# Patient Record
Sex: Female | Born: 1980 | Race: Black or African American | Hispanic: No | Marital: Single | Smoking: Never smoker
Health system: Southern US, Community
[De-identification: ages and names within clinical notes are randomized; demographics above are authoritative.]

## PROBLEM LIST (undated history)

## (undated) DIAGNOSIS — O24419 Gestational diabetes mellitus in pregnancy, unspecified control: Secondary | ICD-10-CM

## (undated) DIAGNOSIS — Z789 Other specified health status: Secondary | ICD-10-CM

## (undated) HISTORY — DX: Other specified health status: Z78.9

## (undated) HISTORY — PX: TUBAL LIGATION: SHX77

---
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
## 2002-11-25 ENCOUNTER — Emergency Department (HOSPITAL_COMMUNITY): Admission: EM | Admit: 2002-11-25 | Discharge: 2002-11-25 | Payer: Self-pay | Admitting: Emergency Medicine

## 2006-03-19 ENCOUNTER — Emergency Department (HOSPITAL_COMMUNITY): Admission: EM | Admit: 2006-03-19 | Discharge: 2006-03-19 | Payer: Self-pay | Admitting: Emergency Medicine

## 2007-05-09 ENCOUNTER — Inpatient Hospital Stay (HOSPITAL_COMMUNITY): Admission: AD | Admit: 2007-05-09 | Discharge: 2007-05-09 | Payer: Self-pay | Admitting: Obstetrics and Gynecology

## 2007-11-04 ENCOUNTER — Emergency Department (HOSPITAL_COMMUNITY): Admission: EM | Admit: 2007-11-04 | Discharge: 2007-11-04 | Payer: Self-pay | Admitting: Emergency Medicine

## 2008-01-24 ENCOUNTER — Emergency Department (HOSPITAL_COMMUNITY): Admission: EM | Admit: 2008-01-24 | Discharge: 2008-01-24 | Payer: Self-pay | Admitting: Emergency Medicine

## 2009-02-15 ENCOUNTER — Emergency Department (HOSPITAL_COMMUNITY): Admission: EM | Admit: 2009-02-15 | Discharge: 2009-02-15 | Payer: Self-pay | Admitting: Emergency Medicine

## 2009-11-11 ENCOUNTER — Emergency Department (HOSPITAL_COMMUNITY): Admission: EM | Admit: 2009-11-11 | Discharge: 2009-11-11 | Payer: Self-pay | Admitting: Emergency Medicine

## 2010-01-02 ENCOUNTER — Encounter: Admission: RE | Admit: 2010-01-02 | Discharge: 2010-01-02 | Payer: Self-pay | Admitting: Obstetrics

## 2010-08-10 LAB — POCT I-STAT, CHEM 8
BUN: 10 mg/dL (ref 6–23)
Calcium, Ion: 1.17 mmol/L (ref 1.12–1.32)
Chloride: 103 mEq/L (ref 96–112)
Creatinine, Ser: 0.7 mg/dL (ref 0.4–1.2)
Glucose, Bld: 91 mg/dL (ref 70–99)
HCT: 40 % (ref 36.0–46.0)
Hemoglobin: 13.6 g/dL (ref 12.0–15.0)
Potassium: 3.3 mEq/L — ABNORMAL LOW (ref 3.5–5.1)
Sodium: 139 mEq/L (ref 135–145)
TCO2: 25 mmol/L (ref 0–100)

## 2010-08-10 LAB — POCT CARDIAC MARKERS: Myoglobin, poc: 42.6 ng/mL (ref 12–200)

## 2010-08-10 LAB — T4, FREE: Free T4: 1.15 ng/dL (ref 0.80–1.80)

## 2010-08-10 LAB — TSH: TSH: 0.678 u[IU]/mL (ref 0.350–4.500)

## 2010-12-21 ENCOUNTER — Encounter (HOSPITAL_COMMUNITY): Payer: Self-pay | Admitting: *Deleted

## 2010-12-21 ENCOUNTER — Inpatient Hospital Stay (HOSPITAL_COMMUNITY)
Admission: AD | Admit: 2010-12-21 | Discharge: 2010-12-21 | Disposition: A | Discharge status: Home / Self Care | Payer: Self-pay | Source: Ambulatory Visit | Attending: Obstetrics and Gynecology | Admitting: Obstetrics and Gynecology

## 2010-12-21 DIAGNOSIS — N949 Unspecified condition associated with female genital organs and menstrual cycle: Secondary | ICD-10-CM | POA: Insufficient documentation

## 2010-12-21 DIAGNOSIS — N760 Acute vaginitis: Secondary | ICD-10-CM | POA: Insufficient documentation

## 2010-12-21 HISTORY — DX: Other specified health status: Z78.9

## 2010-12-21 LAB — WET PREP, GENITAL: Yeast Wet Prep HPF POC: NONE SEEN

## 2010-12-21 LAB — URINALYSIS, ROUTINE W REFLEX MICROSCOPIC
Bilirubin Urine: NEGATIVE
Glucose, UA: NEGATIVE mg/dL
Hgb urine dipstick: NEGATIVE
Protein, ur: NEGATIVE mg/dL

## 2010-12-21 MED ORDER — METRONIDAZOLE 500 MG PO TABS: 500.0000 mg | ORAL_TABLET | Freq: Two times a day (BID) | ORAL | Status: AC

## 2010-12-21 NOTE — ED Provider Notes (Addendum)
History   Pt presents today c/o vag irritation for the past year. She states she has been using nystatin cream with no relief. Her last episode of intercourse was last October. She denies vag bleeding or discharge.  Chief Complaint  Patient presents with  . Vaginal Pain   HPI  OB History    Grav Para Term Preterm Abortions TAB SAB Ect Mult Living                  Past Medical History  Diagnosis Date  . No pertinent past medical history     Past Surgical History  Procedure Date  . No past surgeries     No family history on file.  History  Substance Use Topics  . Smoking status: Never Smoker   . Smokeless tobacco: Not on file  . Alcohol Use: Yes     occasional    Allergies: Allergies not on file  No prescriptions prior to admission    Review of Systems  Constitutional: Negative for fever.  Gastrointestinal: Negative for nausea, vomiting, abdominal pain, diarrhea and constipation.  Genitourinary: Negative for dysuria, urgency, frequency, hematuria and flank pain.  Neurological: Negative for dizziness and headaches.  Psychiatric/Behavioral: Negative for depression and suicidal ideas.   Physical Exam   Blood pressure 139/95, pulse 103, temperature 98.3 F (36.8 C), temperature source Oral, resp. rate 18, height 5\' 2"  (1.575 m), weight 123 lb 9.6 oz (56.065 kg), last menstrual period 12/11/2010.  Physical Exam  Constitutional: She is oriented to person, place, and time. She appears well-developed and well-nourished. No distress.  HENT:  Head: Normocephalic and atraumatic.  Eyes: EOM are normal. Pupils are equal, round, and reactive to light.  GI: Soft. She exhibits no distension. There is no tenderness. There is no rebound and no guarding.  Genitourinary: No bleeding around the vagina. Vaginal discharge found.       Thin vag dc is present. No lesions noted on vag mucosa.  Neurological: She is alert and oriented to person, place, and time.  Skin: Skin is warm and  dry. She is not diaphoretic.  Psychiatric: She has a normal mood and affect. Her behavior is normal. Judgment and thought content normal.    MAU Course  Procedures  Wet prep and GC/Chlamydia cultures performed.  Results for orders placed during the hospital encounter of 12/21/10 (from the past 24 hour(s))  URINALYSIS, ROUTINE W REFLEX MICROSCOPIC     Status: Abnormal   Collection Time   12/21/10  9:30 AM      Component Value Range   Color, Urine YELLOW  YELLOW    Appearance CLEAR  CLEAR    Specific Gravity, Urine >1.030 (*) 1.005 - 1.030    pH 6.0  5.0 - 8.0    Glucose, UA NEGATIVE  NEGATIVE (mg/dL)   Hgb urine dipstick NEGATIVE  NEGATIVE    Bilirubin Urine NEGATIVE  NEGATIVE    Ketones, ur NEGATIVE  NEGATIVE (mg/dL)   Protein, ur NEGATIVE  NEGATIVE (mg/dL)   Urobilinogen, UA 1.0  0.0 - 1.0 (mg/dL)   Nitrite NEGATIVE  NEGATIVE    Leukocytes, UA NEGATIVE  NEGATIVE   POCT PREGNANCY, URINE     Status: Normal   Collection Time   12/21/10  9:35 AM      Component Value Range   Preg Test, Ur NEGATIVE    WET PREP, GENITAL     Status: Abnormal   Collection Time   12/21/10  9:46 AM  Component Value Range   Yeast, Wet Prep NONE SEEN  NONE SEEN    Trich, Wet Prep NONE SEEN  NONE SEEN    Clue Cells, Wet Prep NONE SEEN  NONE SEEN    WBC, Wet Prep HPF POC MODERATE (*) NONE SEEN      Assessment and Plan  Vaginitis: discussed with pt at length. Will tx with Flagyl. Warned of antabuse reaction. Discussed diet, activity, risks, and precautions.  Clinton Gallant. Mikael Debell III, DrHSc, MPAS, PA-C  12/21/2010, 9:51 AM

## 2010-12-21 NOTE — ED Notes (Signed)
E. Rice PA informed of elevated BP, no new orders received @ this time.

## 2010-12-21 NOTE — Progress Notes (Signed)
Onset of vaginal irritation for about one year has been tested for STDs in the past all negative, has been using Nystatin cream has not helped.

## 2010-12-21 NOTE — Progress Notes (Signed)
Pt states irritation is worse after using tampons, stopped using tampons in April but irritation continues.  Denies dysuria or vag discharge.

## 2010-12-22 LAB — GC/CHLAMYDIA PROBE AMP, GENITAL: GC Probe Amp, Genital: NEGATIVE

## 2010-12-24 ENCOUNTER — Emergency Department (HOSPITAL_COMMUNITY)
Admission: EM | Admit: 2010-12-24 | Discharge: 2010-12-24 | Disposition: A | Discharge status: Home / Self Care | Payer: Self-pay | Attending: Emergency Medicine | Admitting: Emergency Medicine

## 2010-12-24 DIAGNOSIS — R079 Chest pain, unspecified: Secondary | ICD-10-CM | POA: Insufficient documentation

## 2010-12-24 LAB — POCT I-STAT, CHEM 8
BUN: 9 mg/dL (ref 6–23)
Calcium, Ion: 1.2 mmol/L (ref 1.12–1.32)
Chloride: 105 meq/L (ref 96–112)
Creatinine, Ser: 0.7 mg/dL (ref 0.50–1.10)
Glucose, Bld: 83 mg/dL (ref 70–99)
HCT: 37 % (ref 36.0–46.0)
Hemoglobin: 12.6 g/dL (ref 12.0–15.0)
Potassium: 3.7 meq/L (ref 3.5–5.1)
Sodium: 141 mEq/L (ref 135–145)
TCO2: 25 mmol/L (ref 0–100)

## 2010-12-24 LAB — POCT I-STAT TROPONIN I: Troponin i, poc: 0 ng/mL (ref 0.00–0.08)

## 2011-01-25 LAB — URINALYSIS, ROUTINE W REFLEX MICROSCOPIC
Bilirubin Urine: NEGATIVE
Hgb urine dipstick: NEGATIVE
Specific Gravity, Urine: >1.03 — ABNORMAL HIGH
pH: 5.5

## 2011-01-25 LAB — WET PREP, GENITAL
Clue Cells Wet Prep HPF POC: NONE SEEN
Trich, Wet Prep: NONE SEEN

## 2011-01-25 LAB — POCT PREGNANCY, URINE: Preg Test, Ur: NEGATIVE

## 2011-03-08 NOTE — ED Provider Notes (Signed)
Agree with above note.  Alexis Walters 03/08/2011 3:39 PM

## 2011-05-08 HISTORY — PX: WISDOM TOOTH EXTRACTION: SHX21

## 2012-05-07 HISTORY — PX: DILATION AND CURETTAGE OF UTERUS: SHX78

## 2013-01-17 ENCOUNTER — Emergency Department (HOSPITAL_COMMUNITY)
Admission: EM | Admit: 2013-01-17 | Discharge: 2013-01-17 | Disposition: A | Discharge status: Home / Self Care | Payer: Self-pay | Attending: Emergency Medicine | Admitting: Emergency Medicine

## 2013-01-17 ENCOUNTER — Encounter (HOSPITAL_COMMUNITY): Payer: Self-pay | Admitting: *Deleted

## 2013-01-17 DIAGNOSIS — L299 Pruritus, unspecified: Secondary | ICD-10-CM | POA: Insufficient documentation

## 2013-01-17 DIAGNOSIS — Z79899 Other long term (current) drug therapy: Secondary | ICD-10-CM | POA: Insufficient documentation

## 2013-01-17 DIAGNOSIS — B888 Other specified infestations: Secondary | ICD-10-CM | POA: Insufficient documentation

## 2013-01-17 MED ORDER — PERMETHRIN 5 % EX CREA: TOPICAL_CREAM | CUTANEOUS | Status: DC

## 2013-01-17 MED ORDER — PREDNISONE 20 MG PO TABS: ORAL_TABLET | ORAL | Status: DC

## 2013-01-17 NOTE — ED Notes (Signed)
Pt reports recently being in [place] and now has large red rash to left arm, back and ear. Reports itching that gets worse at night, no relief with benadryl.

## 2013-01-17 NOTE — ED Provider Notes (Signed)
CSN: [id number]     Arrival date & time 01/17/13  1439 History   First MD Initiated Contact with Patient 01/17/13 1511     Chief Complaint  Patient presents with  . Rash   (Consider location/radiation/quality/duration/timing/severity/associated sxs/prior Treatment) The history is provided by the patient. No language interpreter was used.  Alexis Walters is a 32 year old female presenting to the emergency department with rash that started this morning. As per patient she noticed that she had these lesions with continuous itching, more pruritic at nighttime. Patient reported that she recently just finished a road trip from [facility] [place] to [place]. Patient reported that she stayed in hotels and motels for the past 5 nights. Patient reported that she finished her trip with her sister, reported that her sister does not have these lesions. Patient reports that these lesions are red and raised localized to the left arm, right arm, and upper back. Reported that these lesions are continuously itchy. Patient reported that she's been using Benadryl with minimal relief. Denied drainage, weeping, fever, chills, neck pain, neck stiffness, chest pain, shortness of breath, difficulty breathing, changes to detergents/soaps/fabrics, spreading. PCP none  Past Medical History  Diagnosis Date  . No pertinent past medical history    Past Surgical History  Procedure Laterality Date  . No past surgeries     History reviewed. No pertinent family history. History  Substance Use Topics  . Smoking status: Never Smoker   . Smokeless tobacco: Not on file  . Alcohol Use: Yes     Comment: occasional   OB History   Grav Para Term Preterm Abortions TAB SAB Ect Mult Living                 Review of Systems  Constitutional: Negative for fever and chills.  Respiratory: Negative for chest tightness and shortness of breath.   Cardiovascular: Negative for chest pain.  Skin: Positive for rash.   Neurological: Negative for weakness and headaches.  All other systems reviewed and are negative.    Allergies  Review of patient's allergies indicates no known allergies.  Home Medications   Current Outpatient Rx  Name  Route  Sig  Dispense  Refill  . Multiple Vitamins-Minerals (MULTI-VITAMIN GUMMIES PO)   Oral   Take 1 tablet by mouth daily.           Marland Kitchen nystatin-triamcinolone (MYCOLOG II) cream   Topical   Apply 1 application topically 2 (two) times daily.           . permethrin (ELIMITE) 5 % cream      Apply to entire body, neck down to the toes, please leave on for approximately 6-8 hours. Please repeat one week later.   60 g   0   . predniSONE (DELTASONE) 20 MG tablet      3 tabs po day one, then 2 tabs daily x 4 days   11 tablet   0    BP 121/101  Pulse 99  Temp(Src) 98.2 F (36.8 C) (Oral)  Resp 18  SpO2 97%  LMP 01/07/2013 Physical Exam  Nursing note and vitals reviewed. Constitutional: She is oriented to person, place, and time. She appears well-developed and well-nourished. No distress.  HENT:  Head: Normocephalic and atraumatic.  Mouth/Throat: Oropharynx is clear and moist. No oropharyngeal exudate.  No oral lesions noted  Eyes: Conjunctivae and EOM are normal. Pupils are equal, round, and reactive to light. Right eye exhibits no discharge. Left eye exhibits no discharge.  Neck: Normal range of motion. Neck supple.  Negative neck stiffness Negative nuchal rigidity Negative lymphadenopathy  Cardiovascular: Normal rate, regular rhythm and normal heart sounds.  Exam reveals no friction rub.   No murmur heard. Pulses:      Radial pulses are 2+ on the right side, and 2+ on the left side.  Pulmonary/Chest: Effort normal and breath sounds normal. No respiratory distress. She has no wheezes. She has no rales.  Negative respiratory distress identified  Lymphadenopathy:    She has no cervical adenopathy.  Neurological: She is alert and oriented to  person, place, and time. She exhibits normal muscle tone. Coordination normal.  Skin: She is not diaphoretic.  Red raised lesions noted to the left upper arm, upper back, right olecranon process. Raised lesions that are erythematous, blanchable upon palpation. Negative pain upon palpation. Negative drainage or weeping identified. Negative findings of rash to the face, abdomen, legs bilaterally.  Psychiatric: She has a normal mood and affect. Her behavior is normal. Thought content normal.    ED Course  Procedures (including critical care time) Labs Review Labs Reviewed - No data to display Imaging Review No results found.  MDM   1. Infestation by bed bug     Patient presenting to emergency department with rash that started this morning. Patient reports that it is constantly pruritic, more so at nighttime. Patient reports that she's been using Benadryl with negative relief. Patient reported that she recently finished a road trip from [facility] [place] to [place] with sister, reported that she stayed in a hotel/motel every single night for the past 5 nights. Alert and oriented. Lungs clear to auscultation. Heart rate and rhythm normal. Negative acute distress identified. Negative respiratory distress. Negative oral lesions identified. Red raised lesions localized to the left upper arm and upper back and right olecranon process, raised, erythematous and blanchable. Negative pain upon palpation. Negative drainage identified. Suspicion to be bedbugs based on patient's presentation and history. Patient stable, afebrile. Discharged patient with permethrin cream and prednisone. Instructed patient on how to use a permethrin cream. Discussed with patient that if after the second use of permetherin cream the rash is still present to use prednisone. Discussed with patient to continue to use Benadryl as needed, discussed with patient to use no more than 100 mg per day. [facility] next week. Discussed with patient that she needs to clean everything, discussed with patient she'll most likely need an exterminator for her apartment. Discussed with patient that if rash presentation changes, she develops a fever, she experiences neck stiffness or neck pain that she is to report back to emergency department immediately. Discussed with patient to continue to monitor symptoms and if symptoms are to worsen or change report back to emergency department-strict return instructions given. Patient reported plan of care, understood, all questions answered    Raymon Mutton, PA-C 01/18/13 1554

## 2013-01-18 NOTE — ED Provider Notes (Signed)
Medical screening examination/treatment/procedure(s) were performed by non-physician practitioner and as supervising physician I was immediately available for consultation/collaboration.  Raeford Razor, MD 01/18/13 [phone]

## 2017-12-18 ENCOUNTER — Inpatient Hospital Stay (HOSPITAL_COMMUNITY)
Admission: AD | Admit: 2017-12-18 | Discharge: 2017-12-18 | Disposition: A | Discharge status: Home / Self Care | Payer: Medicaid Other | Attending: Obstetrics & Gynecology | Admitting: Obstetrics & Gynecology

## 2017-12-18 ENCOUNTER — Other Ambulatory Visit: Payer: Self-pay

## 2017-12-18 ENCOUNTER — Encounter (HOSPITAL_COMMUNITY): Payer: Self-pay | Admitting: *Deleted

## 2017-12-18 DIAGNOSIS — R1030 Lower abdominal pain, unspecified: Secondary | ICD-10-CM

## 2017-12-18 DIAGNOSIS — R109 Unspecified abdominal pain: Secondary | ICD-10-CM | POA: Diagnosis present

## 2017-12-18 DIAGNOSIS — Z3202 Encounter for pregnancy test, result negative: Secondary | ICD-10-CM | POA: Insufficient documentation

## 2017-12-18 DIAGNOSIS — N760 Acute vaginitis: Secondary | ICD-10-CM | POA: Diagnosis not present

## 2017-12-18 DIAGNOSIS — B9689 Other specified bacterial agents as the cause of diseases classified elsewhere: Secondary | ICD-10-CM | POA: Diagnosis not present

## 2017-12-18 LAB — WET PREP, GENITAL
SPERM: NONE SEEN
Trich, Wet Prep: NONE SEEN
Yeast Wet Prep HPF POC: NONE SEEN

## 2017-12-18 LAB — URINALYSIS, ROUTINE W REFLEX MICROSCOPIC
Bilirubin Urine: NEGATIVE
Glucose, UA: NEGATIVE mg/dL
Hgb urine dipstick: NEGATIVE
Ketones, ur: NEGATIVE mg/dL
Leukocytes, UA: NEGATIVE
Nitrite: NEGATIVE
Protein, ur: NEGATIVE mg/dL
Specific Gravity, Urine: 1.002 — ABNORMAL LOW (ref 1.005–1.030)
pH: 6 (ref 5.0–8.0)

## 2017-12-18 LAB — POCT PREGNANCY, URINE: Preg Test, Ur: NEGATIVE

## 2017-12-18 MED ORDER — METRONIDAZOLE 0.75 % VA GEL: 1.0000 | Freq: Two times a day (BID) | VAGINAL | 0 refills | Status: DC

## 2017-12-18 MED ORDER — LACTINEX PO CHEW: 1.0000 | CHEWABLE_TABLET | Freq: Every day | ORAL | 1 refills | Status: DC

## 2017-12-18 NOTE — MAU Note (Signed)
Pt reports abdominal pain for a couple of weeks, pain is no different, but persisting. Had a c/s 7 months ago in [place], does not have a doctor in this area. Last period 11/03/17. Pt denies pain w/ urination. Denies constipation, denies diarrhea.

## 2017-12-18 NOTE — Discharge Instructions (Signed)

## 2017-12-18 NOTE — MAU Provider Note (Signed)
History     CSN: [id number]  Arrival date and time: 12/18/17 2030   First Provider Initiated Contact with Patient 12/18/17 2102      Chief Complaint  Patient presents with  . Abdominal Pain   HPI  Alexis Walters is a 37 y.o. G2P1011 non pregnant patient who presents to MAU with chief complaint of abdominal pain. This is a new problem, onset two weeks ago. Patient reports pain at the site of her Pfannenstiel incision from her primary LTCS 7 months ago. Pain is mild, 3-5/10, no aggravating or alleviating factors. Denies urinary symptoms, fever, fall, recent illness.  Patient is sexually active, in a committed monogamous relationship with female partner. Does not use birth control. Stopped breastfeeding her son roughly two months ago.  LMP 10/24/17.  History significant for uterine fibroids.  OB History    Gravida  2   Para  1   Term  1   Preterm      AB  1   Living  1     SAB  1   TAB      Ectopic      Multiple      Live Births  1           Past Medical History:  Diagnosis Date  . No pertinent past medical history     Past Surgical History:  Procedure Laterality Date  . CESAREAN SECTION    . DILATION AND CURETTAGE OF UTERUS  2014   for SAB    No family history on file.  Social History   Tobacco Use  . Smoking status: Never Smoker  Substance Use Topics  . Alcohol use: Yes    Comment: occasional  . Drug use: No    Allergies: No Known Allergies  Medications Prior to Admission  Medication Sig Dispense Refill Last Dose  . Multiple Vitamins-Minerals (MULTI-VITAMIN GUMMIES PO) Take 1 tablet by mouth daily.     Past Week at Unknown  . nystatin-triamcinolone (MYCOLOG II) cream Apply 1 application topically 2 (two) times daily.     12/20/2010 at Unknown  . permethrin (ELIMITE) 5 % cream Apply to entire body, neck down to the toes, please leave on for approximately 6-8 hours. Please repeat one week later. 60 g 0   . predniSONE (DELTASONE) 20 MG tablet 3  tabs po day one, then 2 tabs daily x 4 days 11 tablet 0   . Prenatal Vit-Fe Fumarate-FA (MULTIVITAMIN-PRENATAL) 27-0.8 MG TABS tablet Take 1 tablet by mouth daily at 12 noon.       Review of Systems  Respiratory: Negative for shortness of breath.   Gastrointestinal: Positive for abdominal pain. Negative for constipation, diarrhea, nausea and vomiting.  Genitourinary: Negative for difficulty urinating, dyspareunia, dysuria, vaginal bleeding and vaginal pain.  Neurological: Negative for headaches.  All other systems reviewed and are negative.  Physical Exam   Weight 66.2 kg, last menstrual period 11/03/2017.  Physical Exam  Nursing note and vitals reviewed. Constitutional: She is oriented to person, place, and time. She appears well-developed and well-nourished.  Cardiovascular: Normal rate, regular rhythm, normal heart sounds and intact distal pulses.  GI: Soft. Bowel sounds are normal.  Suprapubic tenderness to deep palpation   Genitourinary: Vaginal discharge found.  Genitourinary Comments: Thin white vaginal discharge visible on SSE  Neurological: She is alert and oriented to person, place, and time. She has normal reflexes.  Skin: Skin is warm and dry.  Psychiatric: She has a normal mood and  affect. Her behavior is normal. Judgment and thought content normal.    MAU Course  Procedures  MDM --Negative pregnancy test --No concerning signs on physical exam --Thin white vaginal discharge observed --C/s incision minimal scarring, no fibrotic tissue on palpation  Patient Vitals for the past 24 hrs:  BP Pulse Resp SpO2 Weight  12/18/17 2138 128/84 92 18 100 % -  12/18/17 2043 - - - - 66.2 kg   Orders Placed This Encounter  Procedures  . Wet prep, genital  . US PELVIS (TRANSABDOMINAL ONLY)  . Urinalysis, Routine w reflex microscopic  . Pregnancy, urine POC  . Discharge patient   Results for orders placed or performed during the hospital encounter of 12/18/17 (from the  past 24 hour(s))  Urinalysis, Routine w reflex microscopic     Status: Abnormal   Collection Time: 12/18/17  8:59 PM  Result Value Ref Range   Color, Urine STRAW (A) YELLOW   APPearance CLEAR CLEAR   Specific Gravity, Urine 1.002 (L) 1.005 - 1.030   pH 6.0 5.0 - 8.0   Glucose, UA NEGATIVE NEGATIVE mg/dL   Hgb urine dipstick NEGATIVE NEGATIVE   Bilirubin Urine NEGATIVE NEGATIVE   Ketones, ur NEGATIVE NEGATIVE mg/dL   Protein, ur NEGATIVE NEGATIVE mg/dL   Nitrite NEGATIVE NEGATIVE   Leukocytes, UA NEGATIVE NEGATIVE  Pregnancy, urine POC     Status: None   Collection Time: 12/18/17  9:01 PM  Result Value Ref Range   Preg Test, Ur NEGATIVE NEGATIVE  Wet prep, genital     Status: Abnormal   Collection Time: 12/18/17  9:14 PM  Result Value Ref Range   Yeast Wet Prep HPF POC NONE SEEN NONE SEEN   Trich, Wet Prep NONE SEEN NONE SEEN   Clue Cells Wet Prep HPF POC PRESENT (A) NONE SEEN   WBC, Wet Prep HPF POC MODERATE (A) NONE SEEN   Sperm NONE SEEN    Meds ordered this encounter  Medications  . metroNIDAZOLE (METROGEL VAGINAL) 0.75 % vaginal gel    Sig: Place 1 Applicatorful vaginally 2 (two) times daily.    Dispense:  70 g    Refill:  0    Order Specific Question:   Supervising Provider    Answer:   Donnamae Jude [[id number]]  . lactobacillus acidophilus & bulgar (LACTINEX) chewable tablet    Sig: Chew 1 tablet by mouth at bedtime.    Dispense:  30 tablet    Refill:  1    Order Specific Question:   Supervising Provider    Answer:   Donnamae Jude [[id number]]    Assessment and Plan  --Bacterial vaginosis, pt prefers gel to PO, rx to pharmacy as described above --Abdominal pain, hx uterine fibroids, order placed for outpatient pelvic ultrasound --Discharge home in stable condition  Darlina Rumpf, CNM 12/18/2017, 9:52 PM

## 2017-12-19 LAB — GC/CHLAMYDIA PROBE AMP (~~LOC~~) NOT AT ARMC
Chlamydia: NEGATIVE
Neisseria Gonorrhea: NEGATIVE

## 2018-06-05 ENCOUNTER — Emergency Department (HOSPITAL_COMMUNITY): Payer: Medicaid Other

## 2018-06-05 ENCOUNTER — Encounter (HOSPITAL_COMMUNITY): Payer: Self-pay

## 2018-06-05 ENCOUNTER — Emergency Department (HOSPITAL_COMMUNITY)
Admission: EM | Admit: 2018-06-05 | Discharge: 2018-06-05 | Disposition: A | Discharge status: Home / Self Care | Payer: Medicaid Other | Attending: Emergency Medicine | Admitting: Emergency Medicine

## 2018-06-05 ENCOUNTER — Other Ambulatory Visit: Payer: Self-pay

## 2018-06-05 DIAGNOSIS — R1030 Lower abdominal pain, unspecified: Secondary | ICD-10-CM | POA: Diagnosis not present

## 2018-06-05 DIAGNOSIS — Z3A08 8 weeks gestation of pregnancy: Secondary | ICD-10-CM | POA: Insufficient documentation

## 2018-06-05 DIAGNOSIS — Z79899 Other long term (current) drug therapy: Secondary | ICD-10-CM | POA: Diagnosis not present

## 2018-06-05 DIAGNOSIS — O9989 Other specified diseases and conditions complicating pregnancy, childbirth and the puerperium: Secondary | ICD-10-CM | POA: Insufficient documentation

## 2018-06-05 DIAGNOSIS — R102 Pelvic and perineal pain: Secondary | ICD-10-CM

## 2018-06-05 LAB — COMPREHENSIVE METABOLIC PANEL
ALT: 12 U/L (ref 0–44)
AST: 20 U/L (ref 15–41)
Albumin: 4.2 g/dL (ref 3.5–5.0)
Alkaline Phosphatase: 50 U/L (ref 38–126)
Anion gap: 10 (ref 5–15)
BUN: <5 mg/dL — ABNORMAL LOW (ref 6–20)
CALCIUM: 9.6 mg/dL (ref 8.9–10.3)
CO2: 22 mmol/L (ref 22–32)
Chloride: 103 mmol/L (ref 98–111)
Creatinine, Ser: 0.7 mg/dL (ref 0.44–1.00)
GFR calc Af Amer: >60 mL/min (ref >60)
Glucose, Bld: 74 mg/dL (ref 70–99)
Potassium: 3.2 mmol/L — ABNORMAL LOW (ref 3.5–5.1)
Sodium: 135 mmol/L (ref 135–145)
Total Bilirubin: 0.9 mg/dL (ref 0.3–1.2)
Total Protein: 7.9 g/dL (ref 6.5–8.1)

## 2018-06-05 LAB — CBC WITH DIFFERENTIAL/PLATELET
Abs Immature Granulocytes: 0.02 K/uL (ref 0.00–0.07)
Basophils Absolute: 0 K/uL (ref 0.0–0.1)
Basophils Relative: 0 %
Eosinophils Absolute: 0 K/uL (ref 0.0–0.5)
Eosinophils Relative: 0 %
HCT: 40.3 % (ref 36.0–46.0)
Hemoglobin: 13.3 g/dL (ref 12.0–15.0)
Immature Granulocytes: 0 %
Lymphocytes Relative: 28 %
Lymphs Abs: 2.2 K/uL (ref 0.7–4.0)
MCH: 29.4 pg (ref 26.0–34.0)
MCHC: 33 g/dL (ref 30.0–36.0)
MCV: 89 fL (ref 80.0–100.0)
Monocytes Absolute: 0.5 K/uL (ref 0.1–1.0)
Monocytes Relative: 7 %
Neutro Abs: 5.2 K/uL (ref 1.7–7.7)
Neutrophils Relative %: 65 %
Platelets: 337 K/uL (ref 150–400)
RBC: 4.53 MIL/uL (ref 3.87–5.11)
RDW: 13.9 % (ref 11.5–15.5)
WBC: 8 K/uL (ref 4.0–10.5)
nRBC: 0 % (ref 0.0–0.2)

## 2018-06-05 LAB — LIPASE, BLOOD: Lipase: 29 U/L (ref 11–51)

## 2018-06-05 LAB — URINALYSIS, ROUTINE W REFLEX MICROSCOPIC
Bilirubin Urine: NEGATIVE
Glucose, UA: 50 mg/dL — AB
Hgb urine dipstick: NEGATIVE
Ketones, ur: NEGATIVE mg/dL
Leukocytes, UA: NEGATIVE
Nitrite: NEGATIVE
Protein, ur: NEGATIVE mg/dL
Specific Gravity, Urine: 1.025 (ref 1.005–1.030)
pH: 6 (ref 5.0–8.0)

## 2018-06-05 LAB — HCG, QUANTITATIVE, PREGNANCY: hCG, Beta Chain, Quant, S: 232836 m[IU]/mL — ABNORMAL HIGH (ref <5)

## 2018-06-05 LAB — HCG, SERUM, QUALITATIVE: Preg, Serum: POSITIVE — AB

## 2018-06-05 MED ORDER — SODIUM CHLORIDE 0.9 % IV BOLUS
1000.0000 mL | Freq: Once | INTRAVENOUS | Status: AC
  Administered 2018-06-05: 1000 mL via INTRAVENOUS

## 2018-06-05 NOTE — ED Notes (Signed)
ED Provider at bedside. 

## 2018-06-05 NOTE — ED Notes (Signed)
Urine culture save tube sent to lab.

## 2018-06-05 NOTE — Discharge Instructions (Addendum)
Your [place] today showed a single intra single uterine with an estimated delivery date of January 15, 2019.  Please schedule an appointment with your OBGYN for further management of your pregnancy. If you experience any vaginal bleeding, vaginal discharge or fever please return to the ED.

## 2018-06-05 NOTE — ED Notes (Signed)
Got patient into a did patient vitals patient is resting with call bell in reach

## 2018-06-05 NOTE — ED Notes (Signed)
Lab to add on hCG quant

## 2018-06-05 NOTE — ED Triage Notes (Signed)
Pt endorses lower abdominal pain x1 week rated 8/10. Pt denies vaginal bleeding, discharge or urinary symptoms. Pt endorses some nausea, but denies emesis or diarrhea.

## 2018-06-05 NOTE — ED Notes (Signed)
Patient verbalizes understanding of discharge instructions. Opportunity for questioning and answers were provided. Armband removed by staff, pt discharged from ED. Follow up care reviewed. Pt ambulatory to lobby.  

## 2018-06-05 NOTE — ED Provider Notes (Signed)
[facility] EMERGENCY DEPARTMENT Provider Note   CSN: [id number] Arrival date & time: 06/05/18  1140     History   Chief Complaint Chief Complaint  Patient presents with  . Abdominal Pain    HPI Alexis Walters is a 38 y.o. female.  38 y.o female with a no PMH presents to the ED with a chief complaint of abdominal pain x 1 week. Patient describes her pain along the lower part of her abdomen worst on the suprapubic area. She reports no alleviating or exacerbating factors. She has not tried any medical relieve for pain. She denies any vaginal discharge, vaginal bleeding, constipation, diarrhea, N/V or uri symptoms. LMP was 04/15/2019.      Past Medical History:  Diagnosis Date  . No pertinent past medical history     There are no active problems to display for this patient.   Past Surgical History:  Procedure Laterality Date  . CESAREAN SECTION    . DILATION AND CURETTAGE OF UTERUS  2014   for SAB     OB History    Gravida  2   Para  1   Term  1   Preterm      AB  1   Living  1     SAB  1   TAB      Ectopic      Multiple      Live Births  1            Home Medications    Prior to Admission medications   Medication Sig Start Date End Date Taking? Authorizing Provider  lactobacillus acidophilus & bulgar (LACTINEX) chewable tablet Chew 1 tablet by mouth at bedtime. 12/18/17   Darlina Rumpf, CNM  metroNIDAZOLE (METROGEL VAGINAL) 0.75 % vaginal gel Place 1 Applicatorful vaginally 2 (two) times daily. 12/18/17   Darlina Rumpf, CNM  Multiple Vitamins-Minerals (MULTI-VITAMIN GUMMIES PO) Take 1 tablet by mouth daily.      [provider]    Family History No family history on file.  Social History Social History   Tobacco Use  . Smoking status: Never Smoker  . Smokeless tobacco: Never Used  Substance Use Topics  . Alcohol use: Yes    Comment: occasional  . Drug use: No     Allergies   Patient has  no known allergies.   Review of Systems Review of Systems  Constitutional: Negative for chills and fever.  HENT: Negative for ear pain and sore throat.   Eyes: Negative for pain and visual disturbance.  Respiratory: Negative for cough and shortness of breath.   Cardiovascular: Negative for chest pain and palpitations.  Gastrointestinal: Positive for abdominal pain. Negative for diarrhea, nausea and vomiting.  Genitourinary: Negative for dysuria, hematuria, vaginal bleeding and vaginal discharge.  Musculoskeletal: Negative for arthralgias and back pain.  Skin: Negative for color change and rash.  Neurological: Negative for seizures and syncope.  All other systems reviewed and are negative.    Physical Exam Updated Vital Signs BP (!) 141/88 (BP Location: Right Arm)   Pulse (!) 112   Temp 99 F (37.2 C) (Oral)   Resp 20   Ht 5\' 2"  (1.575 m)   Wt 67.6 kg   LMP 04/14/2018 (Exact Date)   SpO2 100%   BMI 27.25 kg/m   Physical Exam Vitals signs and nursing note reviewed.  Constitutional:      General: She is not in acute distress.    Appearance:  She is well-developed.     Comments: Well appearing laughing during examination.   HENT:     Head: Normocephalic and atraumatic.     Mouth/Throat:     Pharynx: No oropharyngeal exudate.  Eyes:     Pupils: Pupils are equal, round, and reactive to light.  Neck:     Musculoskeletal: Normal range of motion.  Cardiovascular:     Rate and Rhythm: Regular rhythm.     Heart sounds: Normal heart sounds.  Pulmonary:     Effort: Pulmonary effort is normal. No respiratory distress.     Breath sounds: Normal breath sounds.  Abdominal:     General: Bowel sounds are normal. There is no distension.     Palpations: Abdomen is soft.     Tenderness: There is no abdominal tenderness. There is no right CVA tenderness or left CVA tenderness.     Comments: No tenderness upon palpation during examination.   Musculoskeletal:        General: No  tenderness or deformity.     Right lower leg: No edema.     Left lower leg: No edema.  Skin:    General: Skin is warm and dry.  Neurological:     Mental Status: She is alert and oriented to person, place, and time.      ED Treatments / Results  Labs (all labs ordered are listed, but only abnormal results are displayed) Labs Reviewed  COMPREHENSIVE METABOLIC PANEL - Abnormal; Notable for the following components:      Result Value   Potassium 3.2 (*)    BUN <5 (*)    All other components within normal limits  URINALYSIS, ROUTINE W REFLEX MICROSCOPIC - Abnormal; Notable for the following components:   APPearance HAZY (*)    Glucose, UA 50 (*)    All other components within normal limits  HCG, SERUM, QUALITATIVE - Abnormal; Notable for the following components:   Preg, Serum POSITIVE (*)    All other components within normal limits  HCG, QUANTITATIVE, PREGNANCY - Abnormal; Notable for the following components:   hCG, Beta Chain, Quant, S 232,836 (*)    All other components within normal limits  CBC WITH DIFFERENTIAL/PLATELET  LIPASE, BLOOD    EKG None  Radiology US Ob Less Than 14 Weeks With Ob Transvaginal  Result Date: 06/05/2018 CLINICAL DATA:  Pelvic pain for 3 days. EXAM: OBSTETRIC <14 WK [place] AND TRANSVAGINAL OB US TECHNIQUE: Both transabdominal and transvaginal ultrasound examinations were performed for complete evaluation of the gestation as well as the maternal uterus, adnexal regions, and pelvic cul-de-sac. Transvaginal technique was performed to assess early pregnancy. COMPARISON:  None. FINDINGS: Intrauterine gestational sac: Single Yolk sac:  Visualized. Embryo:  Visualized. Cardiac Activity: Visualized. Heart Rate: 163 bpm MSD:   mm    w     d CRL: 16.1 mm 8 w 0 d [place] EDC: January 15, 2019 Subchorionic hemorrhage:  None visualized. Maternal uterus/adnexae: The left ovary is normal. There is a rounded structure in the right adnexa containing a 1.6 x 1.5 x 1.6 cm cyst.  There is a rim of soft tissue which does not contain any additional follicles. This is favored to be the right ovary as a separate ovary is not seen and the patient has an intrauterine pregnancy. Mild fluid in the pelvis is likely physiologic. IMPRESSION: 1. Single live IUP. 2. The rounded cyst containing structure in the right adnexa is favored to represent the right ovary. A separate right ovary  is not seen and there is an intrauterine pregnancy identified. Recommend attention on follow-up. 3. Mild free fluid in the pelvis is likely physiologic. Electronically Signed   By: Dorise Bullion III M.D   On: 06/05/2018 14:55    Procedures Procedures (including critical care time)  Medications Ordered in ED Medications  sodium chloride 0.9 % bolus 1,000 mL (1,000 mLs Intravenous New Bag/Given 06/05/18 1351)     Initial Impression / Assessment and Plan / ED Course  I have reviewed the triage vital signs and the nursing notes.  Pertinent labs & imaging results that were available during my care of the patient were reviewed by me and considered in my medical decision making (see chart for details).   Patient with no previous medical history presents to the ED with a chief complaint of lower abdominal pain, states her pain is worse suprapubically.  Upon examination there is no focal point of tenderness, patient is laughing throughout exam resting in bed.  Patient's last menstrual period was December, reports she has not had a period since denies any use of birth control.  Denies any vaginal bleeding, vaginal discharge or other gynecological complaints.  Will obtain basic blood work along with urine screening for any infection.  CBC showed no leukocytosis, hemoglobin is stable.  UA showed no nitrites, leukocytes, white blood cell count she denies any urinary symptoms at this time. CMP showed no electrolyte abnormality. LFTs are within normal limits. Lipase was within normal limits. HCG test was  positive.  Patient states she did not know she was pregnant, will offer patient pelvic exam but will ultimately obtain pelvic US to r/o any ectopic or acute process.   US showed:  1. Single live IUP.  2. The rounded cyst containing structure in the right adnexa is  favored to represent the right ovary. A separate right ovary is not  seen and there is an intrauterine pregnancy identified. Recommend  attention on follow-up.  3. Mild free fluid in the pelvis is likely physiologic.     Patient has been informed of the results, she will follow up with OB.Return precautions at length.   Final Clinical Impressions(s) / ED Diagnoses   Final diagnoses:  Lower abdominal pain  [redacted] weeks gestation of pregnancy    ED Discharge Orders    None       Janeece Fitting, PA-C 06/05/18 [place], Gwenyth Allegra, MD 06/05/18 1946

## 2018-06-05 NOTE — ED Notes (Signed)
Patient transported to Ultrasound 

## 2018-07-08 ENCOUNTER — Other Ambulatory Visit: Payer: Self-pay

## 2018-07-08 ENCOUNTER — Ambulatory Visit (INDEPENDENT_AMBULATORY_CARE_PROVIDER_SITE_OTHER): Payer: Medicaid Other | Admitting: *Deleted

## 2018-07-08 ENCOUNTER — Other Ambulatory Visit (HOSPITAL_COMMUNITY)
Admission: RE | Admit: 2018-07-08 | Discharge: 2018-07-08 | Disposition: A | Discharge status: Home / Self Care | Payer: Medicaid Other | Source: Ambulatory Visit | Attending: Obstetrics & Gynecology | Admitting: Obstetrics & Gynecology

## 2018-07-08 ENCOUNTER — Encounter: Payer: Self-pay | Admitting: *Deleted

## 2018-07-08 VITALS — BP 129/75 | HR 97 | Wt 153.5 lb

## 2018-07-08 DIAGNOSIS — O0990 Supervision of high risk pregnancy, unspecified, unspecified trimester: Secondary | ICD-10-CM | POA: Insufficient documentation

## 2018-07-08 DIAGNOSIS — O34219 Maternal care for unspecified type scar from previous cesarean delivery: Secondary | ICD-10-CM

## 2018-07-08 LAB — POCT URINALYSIS DIP (DEVICE)
Bilirubin Urine: NEGATIVE
Glucose, UA: NEGATIVE mg/dL
Hgb urine dipstick: NEGATIVE
Ketones, ur: NEGATIVE mg/dL
Nitrite: NEGATIVE
Protein, ur: NEGATIVE mg/dL
Specific Gravity, Urine: >=1.03 (ref 1.005–1.030)
Urobilinogen, UA: 0.2 mg/dL (ref 0.0–1.0)
pH: 6 (ref 5.0–8.0)

## 2018-07-08 NOTE — Progress Notes (Signed)
New Ob intake completed and pregnancy information packet given. Labs drawn and Medicaid Home form completed. Last Pap 06/2017 - normal. Pt had previous C/S delivery in [place], [place]. She will sign ROI to obtain previous records. Initial prenatal visit scheduled on 3/18.

## 2018-07-09 ENCOUNTER — Encounter: Payer: Self-pay | Admitting: *Deleted

## 2018-07-09 DIAGNOSIS — O0993 Supervision of high risk pregnancy, unspecified, third trimester: Secondary | ICD-10-CM | POA: Insufficient documentation

## 2018-07-09 DIAGNOSIS — O34219 Maternal care for unspecified type scar from previous cesarean delivery: Secondary | ICD-10-CM | POA: Insufficient documentation

## 2018-07-09 DIAGNOSIS — O0990 Supervision of high risk pregnancy, unspecified, unspecified trimester: Principal | ICD-10-CM

## 2018-07-09 DIAGNOSIS — O09529 Supervision of elderly multigravida, unspecified trimester: Secondary | ICD-10-CM | POA: Insufficient documentation

## 2018-07-09 NOTE — Progress Notes (Signed)
I have reviewed the chart and agree with nursing staff's documentation of this patient's encounter.  Kerry Hough, PA-C 07/09/2018 2:03 PM

## 2018-07-10 LAB — GC/CHLAMYDIA PROBE AMP (~~LOC~~) NOT AT ARMC
Chlamydia: NEGATIVE
Neisseria Gonorrhea: NEGATIVE

## 2018-07-10 LAB — URINE CULTURE, OB REFLEX

## 2018-07-10 LAB — CULTURE, OB URINE

## 2018-07-14 ENCOUNTER — Telehealth: Payer: Self-pay | Admitting: *Deleted

## 2018-07-14 NOTE — Telephone Encounter (Signed)
I called Alexis Walters and left a message I am returning her call and she may call us back and leave a detailed message if she still has a question.

## 2018-07-14 NOTE — Telephone Encounter (Signed)
Kinnley called and left a voicemessage 07/11/18 pm that she has questions about her upcoming appointment.

## 2018-07-22 LAB — INHERITEST(R) CF/SMA PANEL

## 2018-07-22 LAB — OBSTETRIC PANEL, INCLUDING HIV
Antibody Screen: NEGATIVE
Basophils Absolute: 0 10*3/uL (ref 0.0–0.2)
Basos: 0 %
EOS (ABSOLUTE): 0 10*3/uL (ref 0.0–0.4)
Eos: 0 %
HIV Screen 4th Generation wRfx: NONREACTIVE
Hematocrit: 36.3 % (ref 34.0–46.6)
Hemoglobin: 12.2 g/dL (ref 11.1–15.9)
Hepatitis B Surface Ag: NEGATIVE
IMMATURE GRANS (ABS): 0 10*3/uL (ref 0.0–0.1)
Immature Granulocytes: 0 %
LYMPHS: 25 %
Lymphocytes Absolute: 2 10*3/uL (ref 0.7–3.1)
MCH: 29.7 pg (ref 26.6–33.0)
MCHC: 33.6 g/dL (ref 31.5–35.7)
MCV: 88 fL (ref 79–97)
MONOCYTES: 6 %
Monocytes Absolute: 0.5 10*3/uL (ref 0.1–0.9)
Neutrophils Absolute: 5.5 10*3/uL (ref 1.4–7.0)
Neutrophils: 69 %
Platelets: 289 10*3/uL (ref 150–450)
RBC: 4.11 x10E6/uL (ref 3.77–5.28)
RDW: 13.1 % (ref 11.7–15.4)
RPR: NONREACTIVE
Rh Factor: NEGATIVE
Rubella Antibodies, IGG: 6.39 index (ref >0.99)
WBC: 7.9 10*3/uL (ref 3.4–10.8)

## 2018-07-22 LAB — HEMOGLOBINOPATHY EVALUATION
FERRITIN: 16 ng/mL (ref 15–150)
HGB SOLUBILITY: NEGATIVE
Hgb A2 Quant: 2.5 % (ref 1.8–3.2)
Hgb A: 96.4 % (ref 96.4–98.8)
Hgb C: 0 %
Hgb F Quant: 1.1 % (ref 0.0–2.0)
Hgb S: 0 %
Hgb Variant: 0 %

## 2018-07-23 ENCOUNTER — Ambulatory Visit: Payer: Self-pay | Admitting: Clinical

## 2018-07-23 ENCOUNTER — Other Ambulatory Visit: Payer: Self-pay | Admitting: Medical

## 2018-07-23 ENCOUNTER — Encounter: Payer: Self-pay | Admitting: Medical

## 2018-07-23 ENCOUNTER — Other Ambulatory Visit: Payer: Self-pay

## 2018-07-23 ENCOUNTER — Telehealth: Payer: Self-pay

## 2018-07-23 ENCOUNTER — Ambulatory Visit (INDEPENDENT_AMBULATORY_CARE_PROVIDER_SITE_OTHER): Payer: Medicaid Other | Admitting: Medical

## 2018-07-23 VITALS — BP 135/84 | HR 98 | Temp 98.5°F | Wt 154.7 lb

## 2018-07-23 DIAGNOSIS — O0990 Supervision of high risk pregnancy, unspecified, unspecified trimester: Secondary | ICD-10-CM

## 2018-07-23 DIAGNOSIS — O34219 Maternal care for unspecified type scar from previous cesarean delivery: Secondary | ICD-10-CM

## 2018-07-23 DIAGNOSIS — Z3A14 14 weeks gestation of pregnancy: Secondary | ICD-10-CM

## 2018-07-23 DIAGNOSIS — O09522 Supervision of elderly multigravida, second trimester: Secondary | ICD-10-CM

## 2018-07-23 NOTE — Progress Notes (Signed)
   PRENATAL VISIT NOTE  Subjective:  Alexis Walters is a 38 y.o. G3P1011 at [redacted]w[redacted]d being seen today for first prenatal care visit.  She is currently monitored for the following issues for this high-risk pregnancy and has Supervision of high risk pregnancy, antepartum; Previous cesarean delivery, antepartum; and Advanced maternal age in multigravida on their problem list.  Patient reports no complaints.  Contractions: Not present. Vag. Bleeding: None.  Movement: Absent. Denies leaking of fluid.   The following portions of the patient's history were reviewed and updated as appropriate: allergies, current medications, past family history, past medical history, past social history, past surgical history and problem list.   Objective:   Vitals:   07/23/18 1400  BP: 135/84  Pulse: 98  Temp: 98.5 F (36.9 C)  Weight: 154 lb 11.2 oz (70.2 kg)    Fetal Status: Fetal Heart Rate (bpm): 159   Movement: Absent     General:  Alert, oriented and cooperative. Patient is in no acute distress.  Skin: Skin is warm and dry. No rash noted.   Cardiovascular: Normal heart rate noted  Respiratory: Normal respiratory effort, no problems with respiration noted  Abdomen: Soft, gravid, appropriate for gestational age.  Pain/Pressure: Absent     Pelvic: Cervical exam deferred        Extremities: Normal range of motion.  Edema: None  Mental Status: Normal mood and affect. Normal behavior. Normal judgment and thought content.   Assessment and Plan:  Pregnancy: G3P1011 at [redacted]w[redacted]d 1. Supervision of high risk pregnancy, antepartum - [place] MFM OB DETAIL +14 WK; scheduled  - Encouraged patient to call ahead if experiencing respiratory symptoms with fever so that she can be triaged and tested/directed appropriately   2. Previous cesarean delivery, antepartum - Patient unsure if desired TOLAC vs RCS, will discuss again at next visit   3. Multigravida of advanced maternal age in second trimester - < 40, no change -  Decline NIPS  Preterm labor/second trimester warning symptoms and general obstetric precautions including but not limited to vaginal bleeding, contractions, leaking of fluid and fetal movement were reviewed in detail with the patient. Please refer to After Visit Summary for other counseling recommendations.   Return in about 8 weeks (around 09/17/2018) for LOB.  Future Appointments  Date Time Provider [place]  08/25/2018  2:15 PM [facility] [place] 4 WH-[facility]  08/25/2018  2:20 PM [facility]    Kerry Hough, PA-C

## 2018-07-23 NOTE — Telephone Encounter (Signed)
Called pt to make sure that she was going to make her appt & she advised yes, gave current check in process. Pt verbalized understanding.

## 2018-07-23 NOTE — Patient Instructions (Addendum)
Childbirth Education Options: [facility] Department Classes:  Childbirth education classes can help you get ready for a positive parenting experience. You can also meet other expectant parents and get free stuff for your baby. Each class runs for five weeks on the same night and costs $45 for the mother-to-be and her support person. Medicaid covers the cost if you are eligible. Call [phone] to register. [facility] Childbirth Education:  [phone] or [phone] or [email or web address]  Baby & Me Class: Discuss newborn & infant parenting and family adjustment issues with other new mothers in a relaxed environment. Each week brings a new speaker or baby-centered activity. We encourage new mothers to join [place] every Thursday at 11:00am. Babies birth until crawling. No registration or fee. Daddy [employer]: This course offers Dads-to-be the tools and knowledge needed to feel confident on their journey to becoming new fathers. Experienced dads, who have been trained as coaches, teach dads-to-be how to hold, comfort, diaper, swaddle and play with their infant while being able to support the new mom as well. A class for men taught by men. $25/dad Big Brother/Big Sister: Let your children share in the joy of a new brother or sister in this special class designed just for them. Class includes discussion about how families care for babies: swaddling, holding, diapering, safety as well as how they can be helpful in their new role. This class is designed for children ages 45 to 48, but any age is welcome. Please register each child individually. $5/child  Mom Talk: This mom-led group offers support and connection to mothers as they journey through the adjustments and struggles of that sometimes overwhelming first year after the birth of a child. Tuesdays at 10:00am and Thursdays at 6:00pm. Babies welcome. No registration or fee. Breastfeeding Support Group: This group is a mother-to-mother  support circle where moms have the opportunity to share their breastfeeding experiences. A Lactation Consultant is present for questions and concerns. Meets each Tuesday at 11:00am. No fee or registration. Breastfeeding Your Baby: Learn what to expect in the first days of breastfeeding your newborn.  This class will help you feel more confident with the skills needed to begin your breastfeeding experience. Many new mothers are concerned about breastfeeding after leaving the hospital. This class will also address the most common fears and challenges about breastfeeding during the first few weeks, months and beyond. (call for fee) Comfort Techniques and Tour: This 2 hour interactive class will provide you the opportunity to learn & practice hands-on techniques that can help relieve some of the discomfort of labor and encourage your baby to rotate toward the best position for birth. You and your partner will be able to try a variety of labor positions with birth balls and rebozos as well as practice breathing, relaxation, and visualization techniques. A tour of the [facility] is included with this class. $20 per registrant and support person Childbirth Class- Weekend Option: This class is a Weekend version of our Birth & Baby series. It is designed for parents who have a difficult time fitting several weeks of classes into their schedule. It covers the care of your newborn and the basics of labor and childbirth. It also includes a [place] of [facility] and lunch. The class is held two consecutive days: beginning on Friday evening from 6:30 - 8:30 p.m. and the next day, Saturday from 9 a.m. - 4 p.m. (call for fee) Doren Custard Class: Interested in a waterbirth?  This  informational class will help you discover whether waterbirth is the right fit for you. Education about waterbirth itself, supplies you would need and how to assemble your support team is what you can  expect from this class. Some obstetrical practices require this class in order to pursue a waterbirth. (Not all obstetrical practices offer waterbirth-check with your healthcare provider.) Register only the expectant mom, but you are encouraged to bring your partner to class! Required if planning waterbirth, no fee. Infant/Child CPR: Parents, grandparents, babysitters, and friends learn Cardio-Pulmonary Resuscitation skills for infants and children. You will also learn how to treat both conscious and unconscious choking in infants and children. This Family & Friends program does not offer certification. Register each participant individually to ensure that enough mannequins are available. (Call for fee) Grandparent Love: Expecting a grandbaby? This class is for you! Learn about the latest infant care and safety recommendations and ways to support your own child as he or she transitions into the parenting role. Taught by Registered Nurses who are childbirth instructors, but most importantly...they are grandmothers too! $10/person. Childbirth Class- Natural Childbirth: This series of 5 weekly classes is for expectant parents who want to learn and practice natural methods of coping with the process of labor and childbirth. Relaxation, breathing, massage, visualization, role of the partner, and helpful positioning are highlighted. Participants learn how to be confident in their body's ability to give birth. This class will empower and help parents make informed decisions about their own care. Includes discussion that will help new parents transition into the immediate postpartum period. Maternity Care Center Tour of [facility] is included. We suggest taking this class between 25-32 weeks, but it's only a recommendation. $75 per registrant and one support person or $30 Medicaid. Childbirth Class- 3 week Series: This option of 3 weekly classes helps you and your labor partner prepare for childbirth. Newborn  care, labor & birth, cesarean birth, pain management, and comfort techniques are discussed and a Maternity Care Center Tour of [facility] is included. The class meets at the same time, on the same day of the week for 3 consecutive weeks beginning with the starting date you choose. $60 for registrant and one support person.  Marvelous Multiples: Expecting twins, triplets, or more? This class covers the differences in labor, birth, parenting, and breastfeeding issues that face multiples' parents. NICU tour is included. Led by a Certified Childbirth Educator who is the mother of twins. No fee. Caring for Baby: This class is for expectant and adoptive parents who want to learn and practice the most up-to-date newborn care for their babies. Focus is on birth through the first six weeks of life. Topics include feeding, bathing, diapering, crying, umbilical cord care, circumcision care and safe sleep. Parents learn to recognize symptoms of illness and when to call the pediatrician. Register only the mom-to-be and your partner or support person can plan to come with you! $10 per registrant and support person Childbirth Class- online option: This online class offers you the freedom to complete a Birth and Baby series in the comfort of your own home. The flexibility of this option allows you to review sections at your own pace, at times convenient to you and your support people. It includes additional video information, animations, quizzes, and extended activities. Get organized with helpful eClass tools, checklists, and trackers. Once you register online for the class, you will receive an email within a few days to accept the invitation and begin the class when the time   is right for you. The content will be available to you for 60 days. $60 for 60 days of online access for you and your support people.  Local Doulas: Natural Baby Doulas [email or web address] Tel:  [phone] [email or web address] [employer] [phone] [email or web address] The Labor Hassell Halim  (also do waterbirth tub rental) [phone] [email or web address] Triad Birth Doula [phone] [email or web address].fi Sacred Rhythms  [phone] https://[email or web address]-[email or web address]/ [employer] (PADA) [email or web address]/ [place] Birth and Baby  [email or web address] Considering Waterbirth? Guide for patients at [facility] [employer]  Why consider waterbirth?  . Gentle birth for babies . Less pain medicine used in labor . May allow for passive descent/less pushing . May reduce perineal tears  . More mobility and instinctive maternal position changes . Increased maternal relaxation . Reduced blood pressure in labor  Is waterbirth safe? What are the risks of infection, drowning or other complications?  . Infection: o Very low risk (3.7 % for tub vs 4.8% for bed) o 7 in 8000 waterbirths with documented infection o Poorly cleaned equipment most common cause o Slightly lower group B strep transmission rate  . Drowning o Maternal:  - Very low risk   - Related to seizures or fainting o Newborn:  - Very low risk. No evidence of increased risk of respiratory problems in multiple large studies - Physiological protection from breathing under water - Avoid underwater birth if there are any fetal complications - Once baby's head is out of the water, keep it out.  . Birth complication o Some reports of cord trauma, but risk decreased by bringing baby to surface gradually o No evidence of increased risk of shoulder dystocia. Mothers can usually change positions faster in water than in a bed, possibly aiding the maneuvers to free the shoulder.   You must attend a Doren Custard class at [facility]  3rd Wednesday of every month from 7-9pm  [employer] by calling [phone] or online at [email or web address]  Bring [place] the certificate from the class to your prenatal appointment  Meet with a midwife at 36 weeks to see if you can still plan a waterbirth and to sign the consent.   Purchase or rent the following supplies:   Water Birth Pool (Birth Pool in a Box or [place] for instance)  (Tubs start ~$125)  Single-use disposable tub liner designed for your brand of tub  New garden hose labeled "lead-free", "suitable for drinking water",  Electric drain pump to remove water (We recommend 792 gallon per hour or greater pump.)   Separate garden hose to remove the dirty water  Fish net  Bathing suit top (optional)  Long-handled mirror (optional)  Places to purchase or rent supplies  GotWebTools.is for tub purchases and supplies  Waterbirthsolutions.com for tub purchases and supplies  The Labor Ladies (www.[email or web address]) $275 for tub rental/set-up & take down/kit   [employer] (http://www.fleming.com/.htm) Information regarding doulas (labor support) who provide pool rentals  Our practice has a Birth Pool in a Box tub at the hospital that you may borrow on a first-come-first-served basis. It is your responsibility to to set up, clean and break down the tub. We cannot guarantee the availability of this tub in advance. You are responsible for bringing all accessories listed above. If you do not have all necessary supplies you cannot have a waterbirth.    Things that would prevent you from having a waterbirth:  Premature, <37wks  Previous cesarean birth  Presence of thick meconium-stained fluid  Multiple gestation (Twins,  triplets, etc.)  Uncontrolled diabetes or gestational diabetes requiring medication  Hypertension requiring medication or diagnosis of pre-eclampsia  Heavy vaginal bleeding  Non-reassuring fetal  heart rate  Active infection (MRSA, etc.). Group B Strep is NOT a contraindication for  waterbirth.  If your labor has to be induced and induction method requires continuous  monitoring of the baby's heart rate  Other risks/issues identified by your obstetrical provider  Please remember that birth is unpredictable. Under certain unforeseeable circumstances your provider may advise against giving birth in the tub. These decisions will be made on a case-by-case basis and with the safety of you and your baby as our highest priority.  Safe Medications in Pregnancy   Acne:  Benzoyl Peroxide  Salicylic Acid   Backache/Headache:  Tylenol: 2 regular strength every 4 hours OR        2 Extra strength every 6 hours   Colds/Coughs/Allergies:  Benadryl (alcohol free) 25 mg every 6 hours as needed  Breath right strips  Claritin  Cepacol throat lozenges  Chloraseptic throat spray  Cold-Eeze- up to three times per day  Cough drops, alcohol free  Flonase (by prescription only)  Guaifenesin  Mucinex  Robitussin DM (plain only, alcohol free)  Saline nasal spray/drops  Sudafed (pseudoephedrine) & Actifed * use only after [redacted] weeks gestation and if you do not have high blood pressure  Tylenol  Vicks Vaporub  Zinc lozenges  Zyrtec   Constipation:  Colace  Ducolax suppositories  Fleet enema  Glycerin suppositories  Metamucil  Milk of magnesia  Miralax  Senokot  Smooth move tea   Diarrhea:  Kaopectate  Imodium A-D   *NO pepto Bismol   Hemorrhoids:  Anusol  Anusol HC  Preparation H  Tucks   Indigestion:  Tums  Maalox  Mylanta  Zantac  Pepcid   Insomnia:  Benadryl (alcohol free) 54m every 6 hours as needed  Tylenol PM  Unisom, no Gelcaps   Leg Cramps:  Tums  MagGel   Nausea/Vomiting:  Bonine  Dramamine  Emetrol  Ginger extract  Sea bands  Meclizine  Nausea medication to take during pregnancy:  Unisom (doxylamine succinate 25 mg tablets) Take one  tablet daily at bedtime. If symptoms are not adequately controlled, the dose can be increased to a maximum recommended dose of two tablets daily (1/2 tablet in the morning, 1/2 tablet mid-afternoon and one at bedtime).  Vitamin B6 1066mtablets. Take one tablet twice a day (up to 200 mg per day).   Skin Rashes:  Aveeno products  Benadryl cream or 2566mvery 6 hours as needed  Calamine Lotion  1% cortisone cream   Yeast infection:  Gyne-lotrimin 7  Monistat 7    **If taking multiple medications, please check labels to avoid duplicating the same active ingredients  **take medication as directed on the label  ** Do not exceed 4000 mg of tylenol in 24 hours  **Do not take medications that contain aspirin or ibuprofen         Places to have your son circumcised:    [phone] while you are in hospital  [facility] [phone] by 4 wks  [facility] [phone] $175 by 2 wks  [facility] [phone] by 7 days [facility] [phone] $269 by 4 wks  These prices sometimes change but are roughly what you can expect to pay. Please call and confirm pricing.   Circumcision is considered an elective/non-medically necessary procedure. There are many reasons parents decide to have their sons  circumsized. During the first year of life circumcised males have a reduced risk of urinary tract infections but after this year the rates between circumcised males and uncircumcised males are the same.  It is safe to have your son circumcised outside of the hospital and the places above perform them regularly.   Deciding about Circumcision in Baby Boys  (Up-to-date The Basics)  What is circumcision?  Circumcision is a surgery that removes the skin that covers the tip of the penis, called the "foreskin" Circumcision is  usually done when a boy is between 52 and 11 days old. In the [place], circumcision is common. In some other countries, fewer boys are circumcised. Circumcision is a common tradition in some religions.  Should I have my baby boy circumcised?  There is no easy answer. Circumcision has some benefits. But it also has risks. After talking with your doctor, you will have to decide for yourself what is right for your family.  What are the benefits of circumcision?  Circumcised boys seem to have slightly lower rates of: ?Urinary tract infections ?Swelling of the opening at the tip of the penis Circumcised men seem to have slightly lower rates of: ?Urinary tract infections ?Swelling of the opening at the tip of the penis ?Penis cancer ?HIV and other infections that you catch during sex ?Cervical cancer in the women they have sex with Even so, in the [place], the risks of these problems are small - even in boys and men who have not been circumcised. Plus, boys and men who are not circumcised can reduce these extra risks by: ?Cleaning their penis well ?Using condoms during sex  What are the risks of circumcision?  Risks include: ?Bleeding or infection from the surgery ?Damage to or amputation of the penis ?A chance that the doctor will cut off too much or not enough of the foreskin ?A chance that sex won't feel as good later in life Only about 1 out of every 200 circumcisions leads to problems. There is also a chance that your health insurance won't pay for circumcision.  How is circumcision done in baby boys?  First, the baby gets medicine for pain relief. This might be a cream on the skin or a shot into the base of the penis. Next, the doctor cleans the baby's penis well. Then he or she uses special tools to cut off the foreskin. Finally, the doctor wraps a bandage (called gauze) around the baby's penis. If you have your baby circumcised, his doctor or nurse will give you  instructions on how to care for him after the surgery. It is important that you follow those instructions carefully.

## 2018-07-23 NOTE — BH Specialist Note (Signed)
Integrated Behavioral Health Initial Visit  MRN: [id number] Name: Alexis Walters  Number of [place] Clinician visits:: 1/6 Session Start time: 2:30  Session End time: 2:40 Total time: 15 minutes  Type of Service: [place] Interpretor:No. Interpretor Name and Language: n/a   Warm Hand Off Completed.       SUBJECTIVE: Alexis Walters is a 38 y.o. female accompanied by n/a Patient was referred by Kerry Hough, PA-C for Initial OB introduction to integrated behavioral health services. Patient reports the following symptoms/concerns: Pt states no particular concern today Duration of problem: n/a; Severity of problem: n/a  OBJECTIVE: Mood: Normal and Affect: Appropriate Risk of harm to self or others: No plan to harm self or others  LIFE CONTEXT: Family and Social: Pt has 1yo child School/Work: - Self-Care: - Life Changes: Current pregnancy  GOALS ADDRESSED: Patient will: 1. Increase knowledge and/or ability of: healthy habits  2. Demonstrate ability to: Increase healthy adjustment to current life circumstances  INTERVENTIONS: Interventions utilized: Psychoeducation and/or Health Education  Standardized Assessments completed: not given today  ASSESSMENT: Patient currently experiencing Supervision of high risk pregnancy, antepartum   Patient may benefit from Initial OB introduction to integrated behavioral health services .  PLAN: 1. Follow up with behavioral health clinician on : As needed 2. Behavioral recommendations:  -Continue taking prenatal vitamin, as recommended by medical provider -Take home and read "SAMHSA: Tips for Social Distancing, Quarantine, and Isolation During An Infectious Disease Outbreak".   3. Referral(s): [place] (In Clinic) 4. "From scale of 1-10, how likely are you to follow plan?": 10  Garlan Fair, LCSW  Depression screen [facility] 2/9 07/08/2018   Decreased Interest 0  Down, Depressed, Hopeless 0  PHQ - 2 Score 0  Altered sleeping 0  Tired, decreased energy 0  Change in appetite 1  Feeling bad or failure about yourself  0  Trouble concentrating 0  Moving slowly or fidgety/restless 0  Suicidal thoughts 0  PHQ-9 Score 1   GAD 7 : Generalized Anxiety Score 07/08/2018  Nervous, Anxious, on Edge 0  Control/stop worrying 0  Worry too much - different things 0  Trouble relaxing 0  Restless 0  Easily annoyed or irritable 0  Afraid - awful might happen 0  Total GAD 7 Score 0

## 2018-07-29 ENCOUNTER — Encounter: Payer: Self-pay | Admitting: *Deleted

## 2018-08-04 ENCOUNTER — Telehealth: Payer: Self-pay | Admitting: *Deleted

## 2018-08-04 NOTE — Telephone Encounter (Signed)
Called pt and discussed Babyscripts optimization schedule requirements. Pt is agreeable to weekly BP and weight however does not have access to BP cuff. Pt advised to come to office within the next few days and we will give her a BP cuff. She has scale at home. Pt will be notified of next office appointment. She voiced understanding of all information and instructions given.

## 2018-08-04 NOTE — Addendum Note (Signed)
Addended by: Langston Reusing on: 08/04/2018 03:08 PM   Modules accepted: Orders

## 2018-08-05 ENCOUNTER — Encounter: Payer: Self-pay | Admitting: *Deleted

## 2018-08-05 NOTE — Progress Notes (Signed)
Pt came to office today and was provided with BP cuff for home use once weekly. She agrees to enter the readings into the Babyscripts App as instructed.

## 2018-08-25 ENCOUNTER — Other Ambulatory Visit (HOSPITAL_COMMUNITY): Payer: Self-pay | Admitting: *Deleted

## 2018-08-25 ENCOUNTER — Ambulatory Visit (HOSPITAL_COMMUNITY): Payer: Medicaid Other | Admitting: *Deleted

## 2018-08-25 ENCOUNTER — Other Ambulatory Visit: Payer: Self-pay

## 2018-08-25 ENCOUNTER — Encounter (HOSPITAL_COMMUNITY): Payer: Self-pay | Admitting: *Deleted

## 2018-08-25 ENCOUNTER — Ambulatory Visit (HOSPITAL_COMMUNITY)
Admission: RE | Admit: 2018-08-25 | Discharge: 2018-08-25 | Disposition: A | Discharge status: Home / Self Care | Payer: Medicaid Other | Source: Ambulatory Visit | Attending: Obstetrics and Gynecology | Admitting: Obstetrics and Gynecology

## 2018-08-25 DIAGNOSIS — O0990 Supervision of high risk pregnancy, unspecified, unspecified trimester: Secondary | ICD-10-CM | POA: Diagnosis present

## 2018-08-25 DIAGNOSIS — D259 Leiomyoma of uterus, unspecified: Secondary | ICD-10-CM

## 2018-08-25 DIAGNOSIS — O09523 Supervision of elderly multigravida, third trimester: Secondary | ICD-10-CM

## 2018-08-25 DIAGNOSIS — O09522 Supervision of elderly multigravida, second trimester: Secondary | ICD-10-CM | POA: Insufficient documentation

## 2018-08-25 DIAGNOSIS — O34219 Maternal care for unspecified type scar from previous cesarean delivery: Secondary | ICD-10-CM

## 2018-08-25 DIAGNOSIS — Z3A19 19 weeks gestation of pregnancy: Secondary | ICD-10-CM

## 2018-08-25 DIAGNOSIS — O3412 Maternal care for benign tumor of corpus uteri, second trimester: Secondary | ICD-10-CM | POA: Diagnosis not present

## 2018-08-25 DIAGNOSIS — Z363 Encounter for antenatal screening for malformations: Secondary | ICD-10-CM

## 2018-09-08 ENCOUNTER — Ambulatory Visit (INDEPENDENT_AMBULATORY_CARE_PROVIDER_SITE_OTHER): Payer: Medicaid Other | Admitting: Obstetrics & Gynecology

## 2018-09-08 VITALS — BP 129/86

## 2018-09-08 DIAGNOSIS — O09522 Supervision of elderly multigravida, second trimester: Secondary | ICD-10-CM

## 2018-09-08 DIAGNOSIS — O0990 Supervision of high risk pregnancy, unspecified, unspecified trimester: Secondary | ICD-10-CM

## 2018-09-08 DIAGNOSIS — O0992 Supervision of high risk pregnancy, unspecified, second trimester: Secondary | ICD-10-CM

## 2018-09-08 DIAGNOSIS — O34219 Maternal care for unspecified type scar from previous cesarean delivery: Secondary | ICD-10-CM

## 2018-09-08 NOTE — Progress Notes (Signed)
Pt has access to babyscripts and reports a blood pressure of 129/86.

## 2018-09-08 NOTE — Progress Notes (Signed)
   TELEHEALTH VIRTUAL OBSTETRICS PRENATAL VISIT ENCOUNTER NOTE  I connected with Josem Kaufmann on 09/08/18 at  2:35 PM EDT by WebEx at home and verified that I am speaking with the correct person using two identifiers.   I discussed the limitations, risks, security and privacy concerns of performing an evaluation and management service by telephone and the availability of in person appointments. I also discussed with the patient that there may be a patient responsible charge related to this service. The patient expressed understanding and agreed to proceed. Subjective:  Alexis Walters is a 38 y.o. G15P8721 (46 month old son) at [redacted]w[redacted]d being seen today for ongoing prenatal care.  She is currently monitored for the following issues for this low-risk pregnancy and has Supervision of high risk pregnancy, antepartum; Previous cesarean delivery, antepartum; and Advanced maternal age in multigravida on their problem list.  Patient reports no complaints.  Reports fetal movement. Contractions: Not present. Vag. Bleeding: None.  Movement: Present. Denies any contractions, bleeding or leaking of fluid.   The following portions of the patient's history were reviewed and updated as appropriate: allergies, current medications, past family history, past medical history, past social history, past surgical history and problem list.   Objective:  There were no vitals filed for this visit.  Fetal Status:     Movement: Present     General:  Alert, oriented and cooperative. Patient is in no acute distress.  Respiratory: Normal respiratory effort, no problems with respiration noted  Mental Status: Normal mood and affect. Normal behavior. Normal judgment and thought content.  Rest of physical exam deferred due to type of encounter  Assessment and Plan:  Pregnancy: G3P1011 at [redacted]w[redacted]d 1. Multigravida of advanced maternal age in second trimester  2. Supervision of high risk pregnancy, antepartum   3. Previous  cesarean delivery, antepartum   Preterm labor symptoms and general obstetric precautions including but not limited to vaginal bleeding, contractions, leaking of fluid and fetal movement were reviewed in detail with the patient. I discussed the assessment and treatment plan with the patient. The patient was provided an opportunity to ask questions and all were answered. The patient agreed with the plan and demonstrated an understanding of the instructions. The patient was advised to call back or seek an in-person office evaluation/go to [facility] at [facility] for any urgent or concerning symptoms. Please refer to After Visit Summary for other counseling recommendations.   I provided 10 minutes of face-to-face via WebEx time during this encounter.  No follow-ups on file.  Future Appointments  Date Time Provider [place]  11/24/2018  2:15 PM [place] NURSE [place] [facility]  11/24/2018  2:15 PM [place], [place] for [employer], [place]

## 2018-10-01 ENCOUNTER — Other Ambulatory Visit: Payer: Self-pay

## 2018-10-01 ENCOUNTER — Telehealth (INDEPENDENT_AMBULATORY_CARE_PROVIDER_SITE_OTHER): Payer: Medicaid Other | Admitting: Family Medicine

## 2018-10-01 VITALS — BP 135/87 | Wt 168.0 lb

## 2018-10-01 DIAGNOSIS — O0992 Supervision of high risk pregnancy, unspecified, second trimester: Secondary | ICD-10-CM

## 2018-10-01 DIAGNOSIS — O34219 Maternal care for unspecified type scar from previous cesarean delivery: Secondary | ICD-10-CM

## 2018-10-01 DIAGNOSIS — O09522 Supervision of elderly multigravida, second trimester: Secondary | ICD-10-CM

## 2018-10-01 DIAGNOSIS — O09529 Supervision of elderly multigravida, unspecified trimester: Secondary | ICD-10-CM

## 2018-10-01 DIAGNOSIS — Z3A24 24 weeks gestation of pregnancy: Secondary | ICD-10-CM

## 2018-10-01 DIAGNOSIS — O0990 Supervision of high risk pregnancy, unspecified, unspecified trimester: Secondary | ICD-10-CM

## 2018-10-01 NOTE — Progress Notes (Signed)
I connected with  Alexis Walters on 10/01/18 at  3:55 PM EDT by telephone and verified that I am speaking with the correct person using two identifiers.   I discussed the limitations, risks, security and privacy concerns of performing an evaluation and management service by telephone and virtually and the availability of in person appointments. I also discussed with the patient that there may be a patient responsible charge related to this service. The patient expressed understanding and agreed to proceed. Patient is active in [employer]s and last bp 122/81 on 09/28/18. I asked her to obtain bp today.   Linda,RN 10/01/2018  3:41 PM

## 2018-10-01 NOTE — Progress Notes (Signed)
   [place] VIRTUAL VIDEO VISIT ENCOUNTER NOTE  Provider location: Center for [place] at [facility]   I connected with Josem Kaufmann on 10/01/18 at  3:55 PM EDT by MyChart Video Encounter at home and verified that I am speaking with the correct person using two identifiers.   I discussed the limitations, risks, security and privacy concerns of performing an evaluation and management service by telephone and the availability of in person appointments. I also discussed with the patient that there may be a patient responsible charge related to this service. The patient expressed understanding and agreed to proceed. Subjective:  Alexis Walters is a 38 y.o. G3P1011 at [redacted]w[redacted]d being seen today for ongoing prenatal care.  She is currently monitored for the following issues for this high-risk pregnancy and has Supervision of high risk pregnancy, antepartum; Previous cesarean delivery, antepartum; and Advanced maternal age in multigravida on their problem list.  Patient reports no complaints.  Contractions: Irregular. Vag. Bleeding: None.  Movement: Present. Denies any leaking of fluid.   The following portions of the patient's history were reviewed and updated as appropriate: allergies, current medications, past family history, past medical history, past social history, past surgical history and problem list.   Objective:   Vitals:   10/01/18 1544  BP: 135/87  Weight: 168 lb (76.2 kg)    Fetal Status:     Movement: Present     General:  Alert, oriented and cooperative. Patient is in no acute distress.  Respiratory: Normal respiratory effort, no problems with respiration noted  Mental Status: Normal mood and affect. Normal behavior. Normal judgment and thought content.  Rest of physical exam deferred due to type of encounter  Imaging: No results found.  Assessment and Plan:  Pregnancy: G3P1011 at [redacted]w[redacted]d 1. Supervision of high risk pregnancy, antepartum Continue  prenatal care.   2. Antepartum multigravida of advanced maternal age Declined genetics  3. Previous cesarean delivery, antepartum Discuss TOLAC vs. RCS at next visit  Preterm labor symptoms and general obstetric precautions including but not limited to vaginal bleeding, contractions, leaking of fluid and fetal movement were reviewed in detail with the patient. I discussed the assessment and treatment plan with the patient. The patient was provided an opportunity to ask questions and all were answered. The patient agreed with the plan and demonstrated an understanding of the instructions. The patient was advised to call back or seek an in-person office evaluation/go to MAU at [facility] for any urgent or concerning symptoms. Please refer to After Visit Summary for other counseling recommendations.   I provided 8 minutes of face-to-face time during this encounter.  Return in about 4 weeks (around 10/29/2018) for [facility], 28 wk labs w/ GTT.  Future Appointments  Date Time Provider [place]  11/24/2018  2:15 PM [place] NURSE [place] MFC-US  11/24/2018  2:15 PM [place], [place] for [facility], [place]

## 2018-10-01 NOTE — Patient Instructions (Signed)

## 2018-10-28 ENCOUNTER — Other Ambulatory Visit: Payer: Self-pay | Admitting: General Practice

## 2018-10-28 DIAGNOSIS — O0990 Supervision of high risk pregnancy, unspecified, unspecified trimester: Secondary | ICD-10-CM

## 2018-10-29 ENCOUNTER — Other Ambulatory Visit: Payer: Self-pay

## 2018-10-29 ENCOUNTER — Other Ambulatory Visit: Payer: Medicaid Other

## 2018-10-29 ENCOUNTER — Ambulatory Visit (INDEPENDENT_AMBULATORY_CARE_PROVIDER_SITE_OTHER): Payer: Medicaid Other | Admitting: Obstetrics & Gynecology

## 2018-10-29 VITALS — BP 137/85 | HR 123 | Wt 174.2 lb

## 2018-10-29 DIAGNOSIS — O360130 Maternal care for anti-D [Rh] antibodies, third trimester, not applicable or unspecified: Secondary | ICD-10-CM

## 2018-10-29 DIAGNOSIS — Z6791 Unspecified blood type, Rh negative: Secondary | ICD-10-CM | POA: Insufficient documentation

## 2018-10-29 DIAGNOSIS — Z23 Encounter for immunization: Secondary | ICD-10-CM | POA: Diagnosis not present

## 2018-10-29 DIAGNOSIS — Z3A28 28 weeks gestation of pregnancy: Secondary | ICD-10-CM

## 2018-10-29 DIAGNOSIS — O34219 Maternal care for unspecified type scar from previous cesarean delivery: Secondary | ICD-10-CM

## 2018-10-29 DIAGNOSIS — O09529 Supervision of elderly multigravida, unspecified trimester: Secondary | ICD-10-CM

## 2018-10-29 DIAGNOSIS — O0990 Supervision of high risk pregnancy, unspecified, unspecified trimester: Secondary | ICD-10-CM

## 2018-10-29 DIAGNOSIS — O26893 Other specified pregnancy related conditions, third trimester: Secondary | ICD-10-CM

## 2018-10-29 LAB — CBC
Hematocrit: 29.1 % — ABNORMAL LOW (ref 34.0–46.6)
Hemoglobin: 9.4 g/dL — ABNORMAL LOW (ref 11.1–15.9)
MCH: 26.9 pg (ref 26.6–33.0)
MCHC: 32.3 g/dL (ref 31.5–35.7)
MCV: 83 fL (ref 79–97)
Platelets: 259 10*3/uL (ref 150–450)
RBC: 3.49 x10E6/uL — ABNORMAL LOW (ref 3.77–5.28)
RDW: 14.3 % (ref 11.7–15.4)
WBC: 8.6 10*3/uL (ref 3.4–10.8)

## 2018-10-29 MED ORDER — RHO D IMMUNE GLOBULIN 1500 UNIT/2ML IJ SOSY
300.0000 ug | PREFILLED_SYRINGE | Freq: Once | INTRAMUSCULAR | Status: AC
  Administered 2018-10-29: 300 ug via INTRAMUSCULAR

## 2018-10-29 NOTE — Progress Notes (Signed)
   PRENATAL VISIT NOTE  Subjective:  Alexis Walters is a 38 y.o. G3P1011 at [redacted]w[redacted]d being seen today for ongoing prenatal care.  She is currently monitored for the following issues for this high-risk pregnancy and has Encounter for supervision of high risk multigravida of advanced maternal age, antepartum; Previous cesarean delivery, antepartum; Advanced maternal age in multigravida; and Rh negative state in antepartum period, third trimester on their problem list.  Patient reports no complaints.  Contractions: Not present. Vag. Bleeding: None.  Movement: Present. Denies leaking of fluid.   The following portions of the patient's history were reviewed and updated as appropriate: allergies, current medications, past family history, past medical history, past social history, past surgical history and problem list.   Objective:   Vitals:   10/29/18 0904  BP: 137/85  Pulse: (!) 123  Weight: 174 lb 3.2 oz (79 kg)    Fetal Status: Fetal Heart Rate (bpm): 154 Fundal Height: 28 cm Movement: Present     General:  Alert, oriented and cooperative. Patient is in no acute distress.  Skin: Skin is warm and dry. No rash noted.   Cardiovascular: Normal heart rate noted  Respiratory: Normal respiratory effort, no problems with respiration noted  Abdomen: Soft, gravid, appropriate for gestational age.  Pain/Pressure: Absent     Pelvic: Cervical exam deferred        Extremities: Normal range of motion.  Edema: None  Mental Status: Normal mood and affect. Normal behavior. Normal judgment and thought content.   Assessment and Plan:  Pregnancy: G3P1011 at [redacted]w[redacted]d 1. Rh negative state in antepartum period, third trimester - rho (d) immune globulin (RHIG/RHOPHYLAC) injection 300 mcg  2. Previous cesarean delivery, antepartum Desires RLTCS and BTS, Medicaid papers signed today  3. Encounter for supervision of high risk multigravida of advanced maternal age, antepartum Third trimester labs and Tdap today.  Preterm labor symptoms and general obstetric precautions including but not limited to vaginal bleeding, contractions, leaking of fluid and fetal movement were reviewed in detail with the patient. Please refer to After Visit Summary for other counseling recommendations.   Return in about 2 weeks (around 11/12/2018) for [facility] Visit.  Future Appointments  Date Time Provider [place]  11/24/2018  2:15 PM [place] NURSE [facility] MFC-US  11/24/2018  2:15 PM [place]    Verita Schneiders, MD

## 2018-10-29 NOTE — Patient Instructions (Signed)
Return to office for any scheduled appointments. Call the office or go to the MAU at [facility] [place] if:  You begin to have strong, frequent contractions  Your water breaks.  Sometimes it is a big gush of fluid, sometimes it is just a trickle that keeps getting your panties wet or running down your legs  You have vaginal bleeding.  It is normal to have a small amount of spotting if your cervix was checked.   You do not feel your baby moving like normal.  If you do not, get something to eat and drink and lay down and focus on feeling your baby move.   If your baby is still not moving like normal, you should call the office or go to MAU.  Any other obstetric concerns.   Third Trimester of Pregnancy The third trimester is from week 28 through week 40 (months 7 through 9). The third trimester is a time when the unborn baby (fetus) is growing rapidly. At the end of the ninth month, the fetus is about 20 inches in length and weighs 6-10 pounds. Body changes during your third trimester Your body will continue to go through many changes during pregnancy. The changes vary from woman to woman. During the third trimester:  Your weight will continue to increase. You can expect to gain 25-35 pounds (11-16 kg) by the end of the pregnancy.  You may begin to get stretch marks on your hips, abdomen, and breasts.  You may urinate more often because the fetus is moving lower into your pelvis and pressing on your bladder.  You may develop or continue to have heartburn. This is caused by increased hormones that slow down muscles in the digestive tract.  You may develop or continue to have constipation because increased hormones slow digestion and cause the muscles that push waste through your intestines to relax.  You may develop hemorrhoids. These are swollen veins (varicose veins) in the rectum that can itch or be painful.  You may develop swollen, bulging veins (varicose veins)  in your legs.  You may have increased body aches in the pelvis, back, or thighs. This is due to weight gain and increased hormones that are relaxing your joints.  You may have changes in your hair. These can include thickening of your hair, rapid growth, and changes in texture. Some women also have hair loss during or after pregnancy, or hair that feels dry or thin. Your hair will most likely return to normal after your baby is born.  Your breasts will continue to grow and they will continue to become tender. A yellow fluid (colostrum) may leak from your breasts. This is the first milk you are producing for your baby.  Your belly button may stick out.  You may notice more swelling in your hands, face, or ankles.  You may have increased tingling or numbness in your hands, arms, and legs. The skin on your belly may also feel numb.  You may feel short of breath because of your expanding uterus.  You may have more problems sleeping. This can be caused by the size of your belly, increased need to urinate, and an increase in your body's metabolism.  You may notice the fetus "dropping," or moving lower in your abdomen (lightening).  You may have increased vaginal discharge.  You may notice your joints feel loose and you may have pain around your pelvic bone. What to expect at prenatal visits You will have   prenatal exams every 2 weeks until week 36. Then you will have weekly prenatal exams. During a routine prenatal visit:  You will be weighed to make sure you and the baby are growing normally.  Your blood pressure will be taken.  Your abdomen will be measured to track your baby's growth.  The fetal heartbeat will be listened to.  Any test results from the previous visit will be discussed.  You may have a cervical check near your due date to see if your cervix has softened or thinned (effaced).  You will be tested for Group B streptococcus. This happens between 35 and 37 weeks. Your  health care provider may ask you:  What your birth plan is.  How you are feeling.  If you are feeling the baby move.  If you have had any abnormal symptoms, such as leaking fluid, bleeding, severe headaches, or abdominal cramping.  If you are using any tobacco products, including cigarettes, chewing tobacco, and electronic cigarettes.  If you have any questions. Other tests or screenings that may be performed during your third trimester include:  Blood tests that check for low iron levels (anemia).  Fetal testing to check the health, activity level, and growth of the fetus. Testing is done if you have certain medical conditions or if there are problems during the pregnancy.  Nonstress test (NST). This test checks the health of your baby to make sure there are no signs of problems, such as the baby not getting enough oxygen. During this test, a belt is placed around your belly. The baby is made to move, and its heart rate is monitored during movement. What is false labor? False labor is a condition in which you feel small, irregular tightenings of the muscles in the womb (contractions) that usually go away with rest, changing position, or drinking water. These are called Braxton Hicks contractions. Contractions may last for hours, days, or even weeks before true labor sets in. If contractions come at regular intervals, become more frequent, increase in intensity, or become painful, you should see your health care provider. What are the signs of labor?  Abdominal cramps.  Regular contractions that start at 10 minutes apart and become stronger and more frequent with time.  Contractions that start on the top of the uterus and spread down to the lower abdomen and back.  Increased pelvic pressure and dull back pain.  A watery or bloody mucus discharge that comes from the vagina.  Leaking of amniotic fluid. This is also known as your "water breaking." It could be a slow trickle or a gush.  Let your health care provider know if it has a color or strange odor. If you have any of these signs, call your health care provider right away, even if it is before your due date. Follow these instructions at home: Medicines  Follow your health care provider's instructions regarding medicine use. Specific medicines may be either safe or unsafe to take during pregnancy.  Take a prenatal vitamin that contains at least 600 micrograms (mcg) of folic acid.  If you develop constipation, try taking a stool softener if your health care provider approves. Eating and drinking   Eat a balanced diet that includes fresh fruits and vegetables, whole grains, good sources of protein such as meat, eggs, or tofu, and low-fat dairy. Your health care provider will help you determine the amount of weight gain that is right for you.  Avoid raw meat and uncooked cheese. These carry germs that   can cause birth defects in the baby.  If you have low calcium intake from food, talk to your health care provider about whether you should take a daily calcium supplement.  Eat four or five small meals rather than three large meals a day.  Limit foods that are high in fat and processed sugars, such as fried and sweet foods.  To prevent constipation: ? Drink enough fluid to keep your urine clear or pale yellow. ? Eat foods that are high in fiber, such as fresh fruits and vegetables, whole grains, and beans. Activity  Exercise only as directed by your health care provider. Most women can continue their usual exercise routine during pregnancy. Try to exercise for 30 minutes at least 5 days a week. Stop exercising if you experience uterine contractions.  Avoid heavy lifting.  Do not exercise in extreme heat or humidity, or at high altitudes.  Wear low-heel, comfortable shoes.  Practice good posture.  You may continue to have sex unless your health care provider tells you otherwise. Relieving pain and discomfort   Take frequent breaks and rest with your legs elevated if you have leg cramps or low back pain.  Take warm sitz baths to soothe any pain or discomfort caused by hemorrhoids. Use hemorrhoid cream if your health care provider approves.  Wear a good support bra to prevent discomfort from breast tenderness.  If you develop varicose veins: ? Wear support pantyhose or compression stockings as told by your healthcare provider. ? Elevate your feet for 15 minutes, 3-4 times a day. Prenatal care  Write down your questions. Take them to your prenatal visits.  Keep all your prenatal visits as told by your health care provider. This is important. Safety  Wear your seat belt at all times when driving.  Make a list of emergency phone numbers, including numbers for family, friends, the hospital, and police and fire departments. General instructions  Avoid cat litter boxes and soil used by cats. These carry germs that can cause birth defects in the baby. If you have a cat, ask someone to clean the litter box for you.  Do not travel far distances unless it is absolutely necessary and only with the approval of your health care provider.  Do not use hot tubs, steam rooms, or saunas.  Do not drink alcohol.  Do not use any products that contain nicotine or tobacco, such as cigarettes and e-cigarettes. If you need help quitting, ask your health care provider.  Do not use any medicinal herbs or unprescribed drugs. These chemicals affect the formation and growth of the baby.  Do not douche or use tampons or scented sanitary pads.  Do not cross your legs for long periods of time.  To prepare for the arrival of your baby: ? Take prenatal classes to understand, practice, and ask questions about labor and delivery. ? Make a trial run to the hospital. ? Visit the hospital and tour the maternity area. ? Arrange for maternity or paternity leave through employers. ? Arrange for family and friends to take  care of pets while you are in the hospital. ? Purchase a rear-facing car seat and make sure you know how to install it in your car. ? Pack your hospital bag. ? Prepare the baby's nursery. Make sure to remove all pillows and stuffed animals from the baby's crib to prevent suffocation.  Visit your dentist if you have not gone during your pregnancy. Use a soft toothbrush to brush your teeth and be  gentle when you floss. Contact a health care provider if:  You are unsure if you are in labor or if your water has broken.  You become dizzy.  You have mild pelvic cramps, pelvic pressure, or nagging pain in your abdominal area.  You have lower back pain.  You have persistent nausea, vomiting, or diarrhea.  You have an unusual or bad smelling vaginal discharge.  You have pain when you urinate. Get help right away if:  Your water breaks before 37 weeks.  You have regular contractions less than 5 minutes apart before 37 weeks.  You have a fever.  You are leaking fluid from your vagina.  You have spotting or bleeding from your vagina.  You have severe abdominal pain or cramping.  You have rapid weight loss or weight gain.  You have shortness of breath with chest pain.  You notice sudden or extreme swelling of your face, hands, ankles, feet, or legs.  Your baby makes fewer than 10 movements in 2 hours.  You have severe headaches that do not go away when you take medicine.  You have vision changes. Summary  The third trimester is from week 28 through week 40, months 7 through 9. The third trimester is a time when the unborn baby (fetus) is growing rapidly.  During the third trimester, your discomfort may increase as you and your baby continue to gain weight. You may have abdominal, leg, and back pain, sleeping problems, and an increased need to urinate.  During the third trimester your breasts will keep growing and they will continue to become tender. A yellow fluid (colostrum)  may leak from your breasts. This is the first milk you are producing for your baby.  False labor is a condition in which you feel small, irregular tightenings of the muscles in the womb (contractions) that eventually go away. These are called Braxton Hicks contractions. Contractions may last for hours, days, or even weeks before true labor sets in.  Signs of labor can include: abdominal cramps; regular contractions that start at 10 minutes apart and become stronger and more frequent with time; watery or bloody mucus discharge that comes from the vagina; increased pelvic pressure and dull back pain; and leaking of amniotic fluid. This information is not intended to replace advice given to you by your health care provider. Make sure you discuss any questions you have with your health care provider. Document Released: 04/17/2001 Document Revised: 05/29/2016 Document Reviewed: 05/29/2016 Elsevier Interactive Patient Education  2019 [employer].

## 2018-10-30 ENCOUNTER — Encounter: Payer: Self-pay | Admitting: Obstetrics & Gynecology

## 2018-10-30 ENCOUNTER — Other Ambulatory Visit: Payer: Self-pay | Admitting: Obstetrics & Gynecology

## 2018-10-30 DIAGNOSIS — O99013 Anemia complicating pregnancy, third trimester: Secondary | ICD-10-CM

## 2018-10-30 DIAGNOSIS — O24419 Gestational diabetes mellitus in pregnancy, unspecified control: Secondary | ICD-10-CM | POA: Insufficient documentation

## 2018-10-30 LAB — ANTIBODY SCREEN: Antibody Screen: NEGATIVE

## 2018-10-30 LAB — HIV ANTIBODY (ROUTINE TESTING W REFLEX): HIV Screen 4th Generation wRfx: NONREACTIVE

## 2018-10-30 LAB — GLUCOSE TOLERANCE, 2 HOURS W/ 1HR
Glucose, 1 hour: 118 mg/dL (ref 65–179)
Glucose, 2 hour: 115 mg/dL (ref 65–152)
Glucose, Fasting: 96 mg/dL — ABNORMAL HIGH (ref 65–91)

## 2018-10-30 LAB — RPR: RPR Ser Ql: NONREACTIVE

## 2018-11-05 ENCOUNTER — Telehealth: Payer: Self-pay

## 2018-11-05 DIAGNOSIS — O24419 Gestational diabetes mellitus in pregnancy, unspecified control: Secondary | ICD-10-CM

## 2018-11-05 NOTE — Telephone Encounter (Addendum)
-----   Message from Osborne Oman, MD sent at 10/30/2018  4:36 PM EDT ----- Patient's 2 hr GTT is abnormal and is consistent with Gestational Diabetes [ ]  GDM education and testing supplies [ ]  Nutrition consult [ ]  Growth scan around time of diagnosis, if indicated [ ]  Enroll in [employer] Diabetes Program Please call to inform patient of results and recommendations.  Notified pt results of glucose and that she dx GDM.  I explained to the pt that she will need a diabetes education.  And due to our educator being out of the office next week I will need to contact another facility.  I asked pt if she wanted to me send info for appt in MyChart.  Pt stated that she would like for me to communicate to her via MyChart message.

## 2018-11-06 ENCOUNTER — Encounter: Payer: Self-pay | Admitting: *Deleted

## 2018-11-06 ENCOUNTER — Other Ambulatory Visit: Payer: Self-pay

## 2018-11-11 ENCOUNTER — Telehealth: Payer: Self-pay | Admitting: Nurse Practitioner

## 2018-11-11 NOTE — Telephone Encounter (Signed)
Attempted to call patient about her appointment on 7/8 @ 10:30. No answer, left voicemail instructing patient that the visit is a mychart visit. Patient instructed to make sure she has the mychart app prior to her appointment. Patient instructed to give the office a call if she needs help getting the app.

## 2018-11-12 ENCOUNTER — Other Ambulatory Visit: Payer: Self-pay

## 2018-11-12 ENCOUNTER — Encounter: Payer: Self-pay | Admitting: *Deleted

## 2018-11-12 ENCOUNTER — Telehealth (INDEPENDENT_AMBULATORY_CARE_PROVIDER_SITE_OTHER): Payer: Medicaid Other | Admitting: Nurse Practitioner

## 2018-11-12 ENCOUNTER — Telehealth: Payer: Self-pay | Admitting: Nurse Practitioner

## 2018-11-12 VITALS — BP 118/81 | Wt 171.0 lb

## 2018-11-12 DIAGNOSIS — O99013 Anemia complicating pregnancy, third trimester: Secondary | ICD-10-CM

## 2018-11-12 DIAGNOSIS — Z3A30 30 weeks gestation of pregnancy: Secondary | ICD-10-CM

## 2018-11-12 DIAGNOSIS — O34219 Maternal care for unspecified type scar from previous cesarean delivery: Secondary | ICD-10-CM

## 2018-11-12 DIAGNOSIS — O24419 Gestational diabetes mellitus in pregnancy, unspecified control: Secondary | ICD-10-CM

## 2018-11-12 DIAGNOSIS — O0993 Supervision of high risk pregnancy, unspecified, third trimester: Secondary | ICD-10-CM

## 2018-11-12 NOTE — Progress Notes (Signed)
   TELEHEALTH VIRTUAL OBSTETRICS VISIT ENCOUNTER NOTE  I connected with Alexis Walters on 11/12/18 at 10:30 AM EDT by MyChart video at home and verified that I am speaking with the correct person using two identifiers.   I discussed the limitations, risks, security and privacy concerns of performing an evaluation and management service by telephone and the availability of in person appointments. I also discussed with the patient that there may be a patient responsible charge related to this service. The patient expressed understanding and agreed to proceed.  Subjective:  Alexis Walters is a 38 y.o. G3P1011 at [redacted]w[redacted]d being followed for ongoing prenatal care.  She is currently monitored for the following issues for this high-risk pregnancy and has Supervision of high risk pregnancy, antepartum, third trimester; Previous cesarean delivery, antepartum; Advanced maternal age in multigravida; Rh negative state in antepartum period, third trimester; Gestational diabetes mellitus in third trimester; and Anemia of pregnancy in third trimester on their problem list.  Patient reports no complaints. Reports fetal movement. Denies any contractions, bleeding or leaking of fluid.   The following portions of the patient's history were reviewed and updated as appropriate: allergies, current medications, past family history, past medical history, past social history, past surgical history and problem list.   Objective:   General:  Alert, oriented and cooperative.   Mental Status: Normal mood and affect perceived. Normal judgment and thought content.  Rest of physical exam deferred due to type of encounter  Assessment and Plan:  Pregnancy: G3P1011 at [redacted]w[redacted]d 1. Supervision of high risk pregnancy, antepartum, third trimester No problems presently - checking and recording BP and weight in babyscripts app - information reviewed in her chart day  2. Anemia of pregnancy in third trimester Iron infusion to be scheduled  - RN checking on appointment and will notify client with MyChart message  3. Gestational diabetes mellitus (GDM) in third trimester, gestational diabetes method of control unspecified Has appointment to see nutritionist tomorrow  4. Previous cesarean delivery, antepartum Wants repeat C/S and wants BTL - has signed consent  Preterm labor symptoms and general obstetric precautions including but not limited to vaginal bleeding, contractions, leaking of fluid and fetal movement were reviewed in detail with the patient.  I discussed the assessment and treatment plan with the patient. The patient was provided an opportunity to ask questions and all were answered. The patient agreed with the plan and demonstrated an understanding of the instructions. The patient was advised to call back or seek an in-person office evaluation/go to MAU at [facility] for any urgent or concerning symptoms. Please refer to After Visit Summary for other counseling recommendations.   I provided 12 minutes of non-face-to-face time during this encounter.  Return in about 2 weeks (around 11/26/2018) for video visit - MyChart.  Future Appointments  Date Time Provider [place]  11/13/2018  3:15 PM NDM-[facility] GDM CLASS NDM-[facility] NDM  11/19/2018 10:00 AM [facility]-MD[facility] ROOM 2 MC-MD[facility] None  11/24/2018  2:15 PM [place] NURSE [place] MFC-US  11/24/2018  2:15 PM [place] 4 [facility]-MFCUS MFC-US  11/26/2018 10:55 AM Julianne Handler, CNM WOC-WOCA WOC  12/10/2018  8:55 AM Lavonia Drafts, MD [facility] WOC  12/24/2018  9:35 AM Donnamae Jude, MD WOC-WOCA WOC  12/31/2018  8:15 AM Donnamae Jude, MD [place], NP Center for [facility], [place]

## 2018-11-12 NOTE — Telephone Encounter (Signed)
Attempted to call patient with her next ob appointments. No answer, left voicemail with the appointments. Patient instructed to give the office a call if needing to reschedule any of the appointments. Appointment reminders mailed out.

## 2018-11-12 NOTE — Progress Notes (Signed)
I connected with  Alexis Walters on 11/12/18 at 10:30 AM EDT by telephone and verified that I am speaking with the correct person using two identifiers.   I discussed the limitations, risks, security and privacy concerns of performing an evaluation and management service by telephone and the availability of in person appointments. I also discussed with the patient that there may be a patient responsible charge related to this service. The patient expressed understanding and agreed to proceed.  She also informed me she has not gotten her feramheme infusion appointment. I informed her I will follow up on that and get back to her with a Mychart message  Micaella Gitto,RN 11/12/2018  10:28 AM

## 2018-11-13 ENCOUNTER — Encounter: Payer: Medicaid Other | Attending: Obstetrics & Gynecology | Admitting: Registered"

## 2018-11-13 DIAGNOSIS — O24419 Gestational diabetes mellitus in pregnancy, unspecified control: Secondary | ICD-10-CM | POA: Diagnosis present

## 2018-11-14 ENCOUNTER — Other Ambulatory Visit: Payer: Self-pay | Admitting: *Deleted

## 2018-11-14 DIAGNOSIS — O24419 Gestational diabetes mellitus in pregnancy, unspecified control: Secondary | ICD-10-CM

## 2018-11-14 MED ORDER — GLUCOSE BLOOD VI STRP: ORAL_STRIP | 12 refills | Status: DC

## 2018-11-14 MED ORDER — ACCU-CHEK FASTCLIX LANCETS MISC: 1.0000 | Freq: Four times a day (QID) | 12 refills | Status: DC

## 2018-11-14 NOTE — Progress Notes (Signed)
Pt sent MyChart message stating she had completed the Diabetes class and received her Accuchek Guide meter. She needs Rx for test strips and lancets - RX e-prescribed as requested.

## 2018-11-15 ENCOUNTER — Encounter: Payer: Self-pay | Admitting: Registered"

## 2018-11-15 NOTE — Progress Notes (Signed)
Patient was seen on 11/13/18 for Gestational Diabetes self-management class at [facility]. The following learning objectives were met by the patient during this course:   States the definition of Gestational Diabetes  States why dietary management is important in controlling blood glucose  Describes the effects each nutrient has on blood glucose levels  Demonstrates ability to create a balanced meal plan  Demonstrates carbohydrate counting   States when to check blood glucose levels  Demonstrates proper blood glucose monitoring techniques  States the effect of stress and exercise on blood glucose levels  States the importance of limiting caffeine and abstaining from alcohol and smoking  Blood glucose monitor given: Accu-chek Guide me Lot # [id number] Exp: 01/29/20 Blood glucose reading: 128 mg/dL  Patient instructed to monitor glucose levels: FBS: 60 - <95; 1 hour: <140; 2 hour: <120  Patient received handouts:  Nutrition Diabetes and Pregnancy, including carb counting list  Patient will be seen for follow-up as needed.

## 2018-11-19 ENCOUNTER — Ambulatory Visit (HOSPITAL_COMMUNITY)
Admission: RE | Admit: 2018-11-19 | Discharge: 2018-11-19 | Disposition: A | Discharge status: Home / Self Care | Payer: Medicaid Other | Source: Ambulatory Visit | Attending: Obstetrics & Gynecology | Admitting: Obstetrics & Gynecology

## 2018-11-19 ENCOUNTER — Other Ambulatory Visit: Payer: Self-pay

## 2018-11-19 DIAGNOSIS — O99013 Anemia complicating pregnancy, third trimester: Secondary | ICD-10-CM

## 2018-11-19 MED ORDER — SODIUM CHLORIDE 0.9 % IV SOLN
510.0000 mg | INTRAVENOUS | Status: DC
  Administered 2018-11-19: 510 mg via INTRAVENOUS
  Filled 2018-11-19: qty 510

## 2018-11-24 ENCOUNTER — Other Ambulatory Visit: Payer: Self-pay

## 2018-11-24 ENCOUNTER — Ambulatory Visit (HOSPITAL_COMMUNITY): Payer: Medicaid Other | Admitting: *Deleted

## 2018-11-24 ENCOUNTER — Encounter (HOSPITAL_COMMUNITY): Payer: Self-pay | Admitting: *Deleted

## 2018-11-24 ENCOUNTER — Other Ambulatory Visit (HOSPITAL_COMMUNITY): Payer: Self-pay | Admitting: *Deleted

## 2018-11-24 ENCOUNTER — Ambulatory Visit (HOSPITAL_COMMUNITY)
Admission: RE | Admit: 2018-11-24 | Discharge: 2018-11-24 | Disposition: A | Discharge status: Home / Self Care | Payer: Medicaid Other | Source: Ambulatory Visit | Attending: Obstetrics and Gynecology | Admitting: Obstetrics and Gynecology

## 2018-11-24 DIAGNOSIS — O0993 Supervision of high risk pregnancy, unspecified, third trimester: Secondary | ICD-10-CM

## 2018-11-24 DIAGNOSIS — O09529 Supervision of elderly multigravida, unspecified trimester: Secondary | ICD-10-CM

## 2018-11-24 DIAGNOSIS — O09523 Supervision of elderly multigravida, third trimester: Secondary | ICD-10-CM | POA: Diagnosis not present

## 2018-11-24 DIAGNOSIS — O24410 Gestational diabetes mellitus in pregnancy, diet controlled: Secondary | ICD-10-CM | POA: Diagnosis not present

## 2018-11-24 DIAGNOSIS — O3413 Maternal care for benign tumor of corpus uteri, third trimester: Secondary | ICD-10-CM | POA: Diagnosis not present

## 2018-11-24 DIAGNOSIS — O34219 Maternal care for unspecified type scar from previous cesarean delivery: Secondary | ICD-10-CM | POA: Diagnosis present

## 2018-11-24 DIAGNOSIS — Z3A32 32 weeks gestation of pregnancy: Secondary | ICD-10-CM

## 2018-11-24 DIAGNOSIS — Z362 Encounter for other antenatal screening follow-up: Secondary | ICD-10-CM | POA: Diagnosis not present

## 2018-11-24 DIAGNOSIS — D259 Leiomyoma of uterus, unspecified: Secondary | ICD-10-CM | POA: Diagnosis not present

## 2018-11-25 ENCOUNTER — Other Ambulatory Visit: Payer: Self-pay

## 2018-11-26 ENCOUNTER — Ambulatory Visit (HOSPITAL_COMMUNITY)
Admission: RE | Admit: 2018-11-26 | Discharge: 2018-11-26 | Disposition: A | Discharge status: Home / Self Care | Payer: Medicaid Other | Source: Ambulatory Visit | Attending: Obstetrics & Gynecology | Admitting: Obstetrics & Gynecology

## 2018-11-26 ENCOUNTER — Telehealth: Payer: Medicaid Other | Admitting: Certified Nurse Midwife

## 2018-11-26 DIAGNOSIS — O99013 Anemia complicating pregnancy, third trimester: Secondary | ICD-10-CM | POA: Insufficient documentation

## 2018-11-26 DIAGNOSIS — O0993 Supervision of high risk pregnancy, unspecified, third trimester: Secondary | ICD-10-CM

## 2018-11-26 MED ORDER — SODIUM CHLORIDE 0.9 % IV SOLN
510.0000 mg | INTRAVENOUS | Status: DC
  Administered 2018-11-26: 13:00:00 510 mg via INTRAVENOUS
  Filled 2018-11-26: qty 17

## 2018-11-26 NOTE — Progress Notes (Signed)
Pt has Feraheme Infusion scheduled today at 12p. Request to be rescheduled for the My Chart visit.

## 2018-11-27 ENCOUNTER — Other Ambulatory Visit: Payer: Self-pay

## 2018-11-27 ENCOUNTER — Telehealth (INDEPENDENT_AMBULATORY_CARE_PROVIDER_SITE_OTHER): Payer: Medicaid Other | Admitting: Obstetrics and Gynecology

## 2018-11-27 VITALS — BP 109/77

## 2018-11-27 DIAGNOSIS — O24419 Gestational diabetes mellitus in pregnancy, unspecified control: Secondary | ICD-10-CM | POA: Diagnosis not present

## 2018-11-27 DIAGNOSIS — Z3009 Encounter for other general counseling and advice on contraception: Secondary | ICD-10-CM | POA: Insufficient documentation

## 2018-11-27 DIAGNOSIS — O09523 Supervision of elderly multigravida, third trimester: Secondary | ICD-10-CM | POA: Diagnosis not present

## 2018-11-27 DIAGNOSIS — Z3A32 32 weeks gestation of pregnancy: Secondary | ICD-10-CM

## 2018-11-27 DIAGNOSIS — O0993 Supervision of high risk pregnancy, unspecified, third trimester: Secondary | ICD-10-CM

## 2018-11-27 DIAGNOSIS — O34219 Maternal care for unspecified type scar from previous cesarean delivery: Secondary | ICD-10-CM | POA: Diagnosis not present

## 2018-11-27 DIAGNOSIS — O09529 Supervision of elderly multigravida, unspecified trimester: Secondary | ICD-10-CM

## 2018-11-27 NOTE — Progress Notes (Signed)
I connected with  Alexis Walters on 11/27/18 at  1:35 PM EDT by telephone and verified that I am speaking with the correct person using two identifiers.   I discussed the limitations, risks, security and privacy concerns of performing an evaluation and management service by telephone and the availability of in person appointments. I also discussed with the patient that there may be a patient responsible charge related to this service. The patient expressed understanding and agreed to proceed.  [place], [place] 11/27/2018  1:23 PM

## 2018-11-27 NOTE — Progress Notes (Signed)
   TELEHEALTH VIRTUAL OBSTETRICS VISIT ENCOUNTER NOTE  I connected with Alexis Walters on 11/27/18 at  1:35 PM EDT by telephone at home and verified that I am speaking with the correct person using two identifiers.   I discussed the limitations, risks, security and privacy concerns of performing an evaluation and management service by telephone and the availability of in person appointments. I also discussed with the patient that there may be a patient responsible charge related to this service. The patient expressed understanding and agreed to proceed.  Subjective:  Alexis Walters is a 38 y.o. G3P1011 at [redacted]w[redacted]d being followed for ongoing prenatal care.  She is currently monitored for the following issues for this high-risk pregnancy and has Supervision of high risk pregnancy, antepartum, third trimester; Previous cesarean delivery, antepartum; Advanced maternal age in multigravida; Rh negative state in antepartum period, third trimester; Gestational diabetes mellitus in third trimester; Anemia of pregnancy in third trimester; and Unwanted fertility on their problem list.  Patient reports no complaints. Reports fetal movement. Denies any contractions, bleeding or leaking of fluid.   The following portions of the patient's history were reviewed and updated as appropriate: allergies, current medications, past family history, past medical history, past social history, past surgical history and problem list.   Objective:   General:  Alert, oriented and cooperative.   Mental Status: Normal mood and affect perceived. Normal judgment and thought content.  Rest of physical exam deferred due to type of encounter  Assessment and Plan:  Pregnancy: G3P1011 at [redacted]w[redacted]d  1. Supervision of high risk pregnancy, antepartum, third trimester  Baby is moving good.  BP 109/77  2. Previous cesarean delivery, antepartum  Desires repeat C-section d/t trauma. Needs to sign consent  Message sent to have Repeat  scheduled for 39-40 weeks. Needs MD visit.    3. Antepartum multigravida of advanced maternal age   63. Gestational diabetes mellitus (GDM) in third trimester, gestational diabetes method of control unspecified  EFW: 2089 gm A1GDM:  Fastings: Mainly 62, one reading was 100 Postprandial: 105,109,97,99,101 Continue low carb, low sugar.    Preterm labor symptoms and general obstetric precautions including but not limited to vaginal bleeding, contractions, leaking of fluid and fetal movement were reviewed in detail with the patient.  I discussed the assessment and treatment plan with the patient. The patient was provided an opportunity to ask questions and all were answered. The patient agreed with the plan and demonstrated an understanding of the instructions. The patient was advised to call back or seek an in-person office evaluation/go to MAU at [facility] for any urgent or concerning symptoms. Please refer to After Visit Summary for other counseling recommendations.   I provided 12 minutes of non-face-to-face time during this encounter.  Return in about 2 weeks (around 12/11/2018) for High risk pregnancy, please change to MD only. Virtual ok .  Future Appointments  Date Time Provider [place]  12/10/2018  8:55 AM Lavonia Drafts, MD WOC-WOCA WOC  12/22/2018  2:30 PM [place] NURSE [place] MFC-US  12/22/2018  2:30 PM [place] 1 WH-MFCUS MFC-US  12/24/2018  9:35 AM Donnamae Jude, MD WOC-WOCA WOC  12/31/2018  8:15 AM Donnamae Jude, MD [place], NP [facility]

## 2018-12-08 ENCOUNTER — Other Ambulatory Visit: Payer: Self-pay

## 2018-12-08 MED ORDER — ACCU-CHEK FASTCLIX LANCETS MISC: 1.0000 | Freq: Four times a day (QID) | 12 refills | Status: DC

## 2018-12-08 MED ORDER — GLUCOSE BLOOD VI STRP: ORAL_STRIP | 12 refills | Status: DC

## 2018-12-08 NOTE — Progress Notes (Signed)
Pt left a mychart message requesting a refill on GDM supplies. Per protocol sent in to patients pharmacy.

## 2018-12-10 ENCOUNTER — Other Ambulatory Visit: Payer: Self-pay

## 2018-12-10 ENCOUNTER — Telehealth (INDEPENDENT_AMBULATORY_CARE_PROVIDER_SITE_OTHER): Payer: Medicaid Other | Admitting: Obstetrics & Gynecology

## 2018-12-10 VITALS — BP 124/84 | HR 102

## 2018-12-10 DIAGNOSIS — O24410 Gestational diabetes mellitus in pregnancy, diet controlled: Secondary | ICD-10-CM

## 2018-12-10 DIAGNOSIS — Z6791 Unspecified blood type, Rh negative: Secondary | ICD-10-CM

## 2018-12-10 DIAGNOSIS — O0993 Supervision of high risk pregnancy, unspecified, third trimester: Secondary | ICD-10-CM

## 2018-12-10 DIAGNOSIS — O26893 Other specified pregnancy related conditions, third trimester: Secondary | ICD-10-CM

## 2018-12-10 DIAGNOSIS — O09529 Supervision of elderly multigravida, unspecified trimester: Secondary | ICD-10-CM

## 2018-12-10 DIAGNOSIS — O34219 Maternal care for unspecified type scar from previous cesarean delivery: Secondary | ICD-10-CM

## 2018-12-10 DIAGNOSIS — O09523 Supervision of elderly multigravida, third trimester: Secondary | ICD-10-CM

## 2018-12-10 DIAGNOSIS — Z3009 Encounter for other general counseling and advice on contraception: Secondary | ICD-10-CM

## 2018-12-10 DIAGNOSIS — Z3A34 34 weeks gestation of pregnancy: Secondary | ICD-10-CM

## 2018-12-10 DIAGNOSIS — O99013 Anemia complicating pregnancy, third trimester: Secondary | ICD-10-CM

## 2018-12-10 NOTE — Progress Notes (Signed)
I connected with  Alexis Walters on 12/10/18 at  8:55 AM EDT by telephone and verified that I am speaking with the correct person using two identifiers.   I discussed the limitations, risks, security and privacy concerns of performing an evaluation and management service by telephone and the availability of in person appointments. I also discussed with the patient that there may be a patient responsible charge related to this service. The patient expressed understanding and agreed to proceed.  [place], CMA 12/10/2018  9:00 AM  Patient is checking her blood glucose but is not adding it to Baby Rx.

## 2018-12-10 NOTE — Progress Notes (Signed)
[place] VIRTUAL VIDEO VISIT ENCOUNTER NOTE  Provider location: [facility] [place] [facility]   I connected with Alexis Walters on 12/10/18 at  8:55 AM EDT by MyChart Video Encounter at home and verified that I am speaking with the correct person using two identifiers.   I discussed the limitations, risks, security and privacy concerns of performing an evaluation and management service virtually and the availability of in person appointments. I also discussed with the patient that there may be a patient responsible charge related to this service. The patient expressed understanding and agreed to proceed. Subjective:  Alexis Walters is a 38 y.o. G3P1011 at [redacted]w[redacted]d being seen today for ongoing prenatal care.  She is currently monitored for the following issues for this high-risk pregnancy and has Supervision of high risk pregnancy, antepartum, third trimester; Previous cesarean delivery, antepartum; Advanced maternal age in multigravida; Rh negative state in antepartum period, third trimester; Gestational diabetes mellitus in third trimester; Anemia of pregnancy in third trimester; and Unwanted fertility on their problem list.  Patient reports no complaints.  Contractions: Not present. Vag. Bleeding: None.  Movement: Present. Denies any leaking of fluid.   The following portions of the patient's history were reviewed and updated as appropriate: allergies, current medications, past family history, past medical history, past social history, past surgical history and problem list.   Objective:   Vitals:   12/10/18 0900  BP: 124/84  Pulse: (!) 102    Fetal Status:     Movement: Present     General:  Alert, oriented and cooperative. Patient is in no acute distress.  Respiratory: Normal respiratory effort, no problems with respiration noted  Mental Status: Normal mood and affect. Normal behavior. Normal judgment and thought content.  Rest of physical exam  deferred due to type of encounter  Imaging: [place] Mfm Ob Follow Up  Result Date: 11/25/2018 ----------------------------------------------------------------------  OBSTETRICS REPORT                       (Signed Final 11/25/2018 11:35 am) ---------------------------------------------------------------------- Patient Info  ID #:       [id number]                          D.O.B.:  1980/07/31 (37 yrs)  Name:       Alexis Walters                   Visit Date: 11/24/2018 02:49 pm ---------------------------------------------------------------------- Performed By  Performed By:     Alexis Walters BS      Ref. Address:     [place],  [place] [address]  Attending:        Sander Walters      [facility]  Referred By:      Alexis Walters                    [place] ---------------------------------------------------------------------- Orders   #  Description                          Code         Ordered By   1  [place] MFM OB FOLLOW UP                  [phone]     Alexis Walters  ----------------------------------------------------------------------   #  Order #                    Accession #                 Episode #   1  [id number]                  [id number]                  [id number]  ---------------------------------------------------------------------- Indications   Encounter for other antenatal screening        Z36.2   follow-up   Uterine fibroids affecting pregnancy in third  O34.13, D25.9   trimester, antepartum   Gestational diabetes in pregnancy, diet        O24.410   controlled   Advanced maternal age multigravida 43+,        O78.523   third trimester (declined screening)   Previous cesarean delivery, antepartum         O34.219   [redacted] weeks gestation of pregnancy                 Z3A.32  ---------------------------------------------------------------------- Vital Signs                                                 Height:        5'2" ---------------------------------------------------------------------- Fetal Evaluation  Num Of Fetuses:         1  Fetal Heart Rate(bpm):  142  Cardiac Activity:       Observed  Presentation:           Cephalic  Placenta:               Posterior  P. Cord Insertion:      Previously Visualized  Amniotic Fluid  AFI FV:      Within normal limits  AFI Sum(cm)     %Tile       Largest Pocket(cm)  19.76           75          6.46  RUQ(cm)       RLQ(cm)  LUQ(cm)        LLQ(cm)  6.43          6.46          4.16           2.71 ---------------------------------------------------------------------- Biometry  BPD:      83.8  mm     G. Age:  33w 5d         87  %    CI:        76.65   %    70 - 86                                                          FL/HC:      19.7   %    19.1 - 21.3  HC:      303.2  mm     G. Age:  33w 5d         105  %    HC/AC:      1.02        0.96 - 1.17  AC:      297.1  mm     G. Age:  33w 5d         90  %    FL/BPD:     71.4   %    71 - 87  FL:       59.8  mm     G. Age:  31w 1d         17  %    FL/AC:      20.1   %    20 - 24  HUM:      53.1  mm     G. Age:  31w 0d         29  %  Est. FW:    2089  gm    4 lb 10 oz      71  % ---------------------------------------------------------------------- OB History  Gravidity:    3         Term:   1         SAB:   1  Living:       1 ---------------------------------------------------------------------- Gestational Age  LMP:           32w 0d        Date:  04/14/18                 EDD:   01/19/19  U/S Today:     33w 1d                                        EDD:   01/11/19  Best:          32w 0d     Det. By:  LMP  (04/14/18)          EDD:   01/19/19 ---------------------------------------------------------------------- Anatomy  Cranium:               Appears normal         Aortic Arch:             Appears normal  Cavum:  Appears normal         Ductal Arch:            Appears normal  Ventricles:            Appears normal         Diaphragm:              Appears normal  Choroid Plexus:        Appears normal         Stomach:                Appears normal, left                                                                        sided  Cerebellum:            Previously seen        Abdomen:                Appears normal  Posterior Fossa:       Previously seen        Abdominal Wall:         Previously seen  Nuchal Fold:           Previously seen        Cord Vessels:           Previously seen  Face:                  Appears normal         Kidneys:                Appear normal                         (orbits and profile)  Lips:                  Appears normal         Bladder:                Appears normal  Thoracic:              Appears normal         Spine:                  Previously seen  Heart:                 Previously seen        Upper Extremities:      Previously seen  RVOT:                  Appears normal         Lower Extremities:      Previously seen  LVOT:                  Appears normal  Other:  Heels and 5th digit previously visualized. Nasal bone visualized.          Open hands previously visualized. ---------------------------------------------------------------------- Cervix Uterus Adnexa  Cervix  Not visualized (advanced GA >24wks)  Uterus  No abnormality visualized.  Left Ovary  Not visualized.  Right Ovary  Not visualized.  Cul Tennis Must  Sac  No free fluid seen.  Adnexa  No abnormality visualized. ---------------------------------------------------------------------- Impression  Normal interval growth.  A1GDM ---------------------------------------------------------------------- Recommendations  Follow up growth in 4 weeks. ----------------------------------------------------------------------               Alexis Nephew, MD Electronically Signed Final Report   11/25/2018 11:35 am  ----------------------------------------------------------------------   Assessment and Plan:  Pregnancy: [id number] at [redacted]w[redacted]d 1. Unwanted fertility BTL with c/s  2. Supervision of high risk pregnancy, antepartum, third trimester +FM  3. Previous cesarean delivery, antepartum repeat with BTL at 39 weeks  4. Antepartum multigravida of advanced maternal age  72. Rh negative state in antepartum period, third trimester Rhogam   6. Diet controlled gestational diabetes mellitus (GDM) in third trimester All but 6 fasting have been WNL. Pt wants to cont to try to improve by diet  Begin walking daily   7. Anemia of pregnancy in third trimester [place]   Preterm labor symptoms and general obstetric precautions including but not limited to vaginal bleeding, contractions, leaking of fluid and fetal movement were reviewed in detail with the patient. I discussed the assessment and treatment plan with the patient. The patient was provided an opportunity to ask questions and all were answered. The patient agreed with the plan and demonstrated an understanding of the instructions. The patient was advised to call back or seek an in-person office evaluation/go to MAU at [facility] for any urgent or concerning symptoms. Please refer to After Visit Summary for other counseling recommendations.   I provided 17 minutes of face-to-face time during this encounter.  No follow-ups on file.  Future Appointments  Date Time Provider [place]  12/22/2018  2:30 PM [place] MFC-US  12/22/2018  2:30 PM [place] 1 WH-MFCUS MFC-US  12/24/2018  9:35 AM Donnamae Jude, MD WOC-WOCA WOC  12/31/2018  8:15 AM Donnamae Jude, MD [facility]    Lavonia Drafts, [place] for [facility], [place]

## 2018-12-11 ENCOUNTER — Encounter: Payer: Self-pay | Admitting: Obstetrics and Gynecology

## 2018-12-22 ENCOUNTER — Encounter (HOSPITAL_COMMUNITY): Payer: Self-pay | Admitting: *Deleted

## 2018-12-22 ENCOUNTER — Other Ambulatory Visit: Payer: Self-pay

## 2018-12-22 ENCOUNTER — Ambulatory Visit (HOSPITAL_COMMUNITY): Payer: Medicaid Other | Admitting: *Deleted

## 2018-12-22 ENCOUNTER — Ambulatory Visit (HOSPITAL_COMMUNITY)
Admission: RE | Admit: 2018-12-22 | Discharge: 2018-12-22 | Disposition: A | Discharge status: Home / Self Care | Payer: Medicaid Other | Source: Ambulatory Visit | Attending: Maternal & Fetal Medicine | Admitting: Maternal & Fetal Medicine

## 2018-12-22 DIAGNOSIS — Z3009 Encounter for other general counseling and advice on contraception: Secondary | ICD-10-CM | POA: Diagnosis present

## 2018-12-22 DIAGNOSIS — O09529 Supervision of elderly multigravida, unspecified trimester: Secondary | ICD-10-CM | POA: Diagnosis present

## 2018-12-22 DIAGNOSIS — Z362 Encounter for other antenatal screening follow-up: Secondary | ICD-10-CM

## 2018-12-22 DIAGNOSIS — O34219 Maternal care for unspecified type scar from previous cesarean delivery: Secondary | ICD-10-CM | POA: Insufficient documentation

## 2018-12-22 DIAGNOSIS — O0993 Supervision of high risk pregnancy, unspecified, third trimester: Secondary | ICD-10-CM

## 2018-12-22 DIAGNOSIS — O24410 Gestational diabetes mellitus in pregnancy, diet controlled: Secondary | ICD-10-CM | POA: Insufficient documentation

## 2018-12-22 DIAGNOSIS — O3413 Maternal care for benign tumor of corpus uteri, third trimester: Secondary | ICD-10-CM | POA: Diagnosis not present

## 2018-12-22 DIAGNOSIS — Z3A36 36 weeks gestation of pregnancy: Secondary | ICD-10-CM

## 2018-12-22 DIAGNOSIS — O09523 Supervision of elderly multigravida, third trimester: Secondary | ICD-10-CM

## 2018-12-22 DIAGNOSIS — D259 Leiomyoma of uterus, unspecified: Secondary | ICD-10-CM

## 2018-12-24 ENCOUNTER — Other Ambulatory Visit: Payer: Self-pay

## 2018-12-24 ENCOUNTER — Ambulatory Visit (INDEPENDENT_AMBULATORY_CARE_PROVIDER_SITE_OTHER): Payer: Medicaid Other | Admitting: Family Medicine

## 2018-12-24 ENCOUNTER — Other Ambulatory Visit (HOSPITAL_COMMUNITY)
Admission: RE | Admit: 2018-12-24 | Discharge: 2018-12-24 | Disposition: A | Discharge status: Home / Self Care | Payer: Medicaid Other | Source: Ambulatory Visit | Attending: Family Medicine | Admitting: Family Medicine

## 2018-12-24 VITALS — BP 135/89 | HR 86 | Wt 176.7 lb

## 2018-12-24 DIAGNOSIS — O34219 Maternal care for unspecified type scar from previous cesarean delivery: Secondary | ICD-10-CM

## 2018-12-24 DIAGNOSIS — Z3A36 36 weeks gestation of pregnancy: Secondary | ICD-10-CM

## 2018-12-24 DIAGNOSIS — O0993 Supervision of high risk pregnancy, unspecified, third trimester: Secondary | ICD-10-CM

## 2018-12-24 DIAGNOSIS — O26893 Other specified pregnancy related conditions, third trimester: Secondary | ICD-10-CM

## 2018-12-24 DIAGNOSIS — Z3009 Encounter for other general counseling and advice on contraception: Secondary | ICD-10-CM

## 2018-12-24 DIAGNOSIS — O24410 Gestational diabetes mellitus in pregnancy, diet controlled: Secondary | ICD-10-CM

## 2018-12-24 DIAGNOSIS — Z6791 Unspecified blood type, Rh negative: Secondary | ICD-10-CM

## 2018-12-24 DIAGNOSIS — O99013 Anemia complicating pregnancy, third trimester: Secondary | ICD-10-CM

## 2018-12-24 DIAGNOSIS — O09523 Supervision of elderly multigravida, third trimester: Secondary | ICD-10-CM

## 2018-12-24 LAB — CBC
Hematocrit: 35.4 % (ref 34.0–46.6)
Hemoglobin: 11.8 g/dL (ref 11.1–15.9)
MCH: 28 pg (ref 26.6–33.0)
MCHC: 33.3 g/dL (ref 31.5–35.7)
MCV: 84 fL (ref 79–97)
Platelets: 198 10*3/uL (ref 150–450)
RBC: 4.21 x10E6/uL (ref 3.77–5.28)
RDW: 19.5 % — ABNORMAL HIGH (ref 11.7–15.4)
WBC: 6 10*3/uL (ref 3.4–10.8)

## 2018-12-24 NOTE — Progress Notes (Signed)
   Subjective:  Alexis Walters is a 38 y.o. G3P1011 at [redacted]w[redacted]d being seen today for ongoing prenatal care.  She is currently monitored for the following issues for this high-risk pregnancy and has Supervision of high risk pregnancy, antepartum, third trimester; Previous cesarean delivery, antepartum; Advanced maternal age in multigravida; Rh negative state in antepartum period, third trimester; Gestational diabetes mellitus in third trimester; Anemia of pregnancy in third trimester; and Unwanted fertility on their problem list.  Patient reports no complaints.  Contractions: Not present. Vag. Bleeding: None.  Movement: Present. Denies leaking of fluid.   The following portions of the patient's history were reviewed and updated as appropriate: allergies, current medications, past family history, past medical history, past social history, past surgical history and problem list. Problem list updated.  Objective:   Vitals:   12/24/18 1000  BP: 135/89  Pulse: 86  Weight: 176 lb 11.2 oz (80.2 kg)    Fetal Status: Fetal Heart Rate (bpm): 152 Fundal Height: 36 cm Movement: Present  Presentation: Vertex  General:  Alert, oriented and cooperative. Patient is in no acute distress.  Skin: Skin is warm and dry. No rash noted.   Cardiovascular: Normal heart rate noted  Respiratory: Normal respiratory effort, no problems with respiration noted  Abdomen: Soft, gravid, appropriate for gestational age. Pain/Pressure: Absent     Pelvic: Vag. Bleeding: None     Cervical exam deferred        Extremities: Normal range of motion.  Edema: None  Mental Status: Normal mood and affect. Normal behavior. Normal judgment and thought content.   Urinalysis:      Assessment and Plan:  Pregnancy: G3P1011 at [redacted]w[redacted]d  1. Supervision of high risk pregnancy, antepartum, third trimester Cultures today - Culture, beta strep (group b only) - GC/Chlamydia probe amp ([place])not at [facility]  2. Diet controlled gestational  diabetes mellitus (GDM) in third trimester 2/11 fasting out of range 10/31, mostly evening meals, ranging up to 150's but mostly in 120's out of range 28% not in range Patient with good grasp of which foods elevate sugars, identifies grapes as cause of most elevated sugars Dietary counseling, stressed importance of improved glucose control  3. Previous cesarean delivery, antepartum Planning rLTCS, scheduled for 01/12/2019  4. Multigravida of advanced maternal age in third trimester   5. Rh negative state in antepartum period, third trimester Rhogam PP  6. Anemia of pregnancy in third trimester Received feraheme x2 last month CBC today  7. Unwanted fertility Planning BTL, papers signed 10/29/2018  Preterm labor symptoms and general obstetric precautions including but not limited to vaginal bleeding, contractions, leaking of fluid and fetal movement were reviewed in detail with the patient. Please refer to After Visit Summary for other counseling recommendations.  Return in 1 week (on 12/31/2018) for [facility], needs MD.   Clarnce Flock, MD

## 2018-12-25 LAB — GC/CHLAMYDIA PROBE AMP (~~LOC~~) NOT AT ARMC
Chlamydia: NEGATIVE
Neisseria Gonorrhea: NEGATIVE

## 2018-12-28 LAB — CULTURE, BETA STREP (GROUP B ONLY): Strep Gp B Culture: NEGATIVE

## 2018-12-29 ENCOUNTER — Encounter (HOSPITAL_COMMUNITY): Payer: Self-pay

## 2018-12-29 NOTE — Patient Instructions (Signed)
ARWA SEBER  12/29/2018   Your procedure is scheduled on:  01/12/2019  Arrive at 29 at Entrance C on [address] at [facility]  and [address]. You are invited to use the FREE valet parking or use the Visitor's parking deck.  Pick up the phone at the desk and dial [phone].  Call this number if you have problems the morning of surgery: [phone]  Remember:   Do not eat food:(After Midnight) Desps de medianoche.  Do not drink clear liquids: (After Midnight) Desps de medianoche.  Take these medicines the morning of surgery with A SIP OF WATER:  none   Do not wear jewelry, make-up or nail polish.  Do not wear lotions, powders, or perfumes. Do not wear deodorant.  Do not shave 48 hours prior to surgery.  Do not bring valuables to the hospital.  [facility] is not   responsible for any belongings or valuables brought to the hospital.  Contacts, dentures or bridgework may not be worn into surgery.  Leave suitcase in the car. After surgery it may be brought to your room.  For patients admitted to the hospital, checkout time is 11:00 AM the day of              discharge.      Please read over the following fact sheets that you were given:     Preparing for Surgery

## 2018-12-30 ENCOUNTER — Telehealth: Payer: Self-pay | Admitting: Obstetrics and Gynecology

## 2018-12-30 NOTE — Telephone Encounter (Signed)
Spoke with patient about her appointment on 8/26 @ 8:35. Patient instructed that the appointment is a mychart visit. Patient instructed to download the mychart app if not already done so. Patient verbalized she has the app downloaded

## 2018-12-31 ENCOUNTER — Encounter: Payer: Self-pay | Admitting: Obstetrics and Gynecology

## 2018-12-31 ENCOUNTER — Telehealth: Payer: Medicaid Other | Admitting: Family Medicine

## 2018-12-31 ENCOUNTER — Other Ambulatory Visit: Payer: Self-pay

## 2018-12-31 ENCOUNTER — Telehealth (INDEPENDENT_AMBULATORY_CARE_PROVIDER_SITE_OTHER): Payer: Medicaid Other | Admitting: Obstetrics and Gynecology

## 2018-12-31 VITALS — BP 119/85 | HR 106

## 2018-12-31 DIAGNOSIS — O26893 Other specified pregnancy related conditions, third trimester: Secondary | ICD-10-CM

## 2018-12-31 DIAGNOSIS — O09523 Supervision of elderly multigravida, third trimester: Secondary | ICD-10-CM

## 2018-12-31 DIAGNOSIS — Z3009 Encounter for other general counseling and advice on contraception: Secondary | ICD-10-CM

## 2018-12-31 DIAGNOSIS — O24410 Gestational diabetes mellitus in pregnancy, diet controlled: Secondary | ICD-10-CM

## 2018-12-31 DIAGNOSIS — Z3A37 37 weeks gestation of pregnancy: Secondary | ICD-10-CM

## 2018-12-31 DIAGNOSIS — O99013 Anemia complicating pregnancy, third trimester: Secondary | ICD-10-CM

## 2018-12-31 DIAGNOSIS — O34219 Maternal care for unspecified type scar from previous cesarean delivery: Secondary | ICD-10-CM

## 2018-12-31 DIAGNOSIS — Z6791 Unspecified blood type, Rh negative: Secondary | ICD-10-CM

## 2018-12-31 DIAGNOSIS — O0993 Supervision of high risk pregnancy, unspecified, third trimester: Secondary | ICD-10-CM

## 2018-12-31 NOTE — Progress Notes (Signed)
   [place] VIRTUAL VIDEO VISIT ENCOUNTER NOTE  Provider location: [facility] [place] [facility]   I connected with Josem Kaufmann on 12/31/18 at  8:35 AM EDT by MyChart Video Encounter at home and verified that I am speaking with the correct person using two identifiers.   I discussed the limitations, risks, security and privacy concerns of performing an evaluation and management service virtually and the availability of in person appointments. I also discussed with the patient that there may be a patient responsible charge related to this service. The patient expressed understanding and agreed to proceed. Subjective:  Alexis Walters is a 38 y.o. G3P1011 at [redacted]w[redacted]d being seen today for ongoing prenatal care.  She is currently monitored for the following issues for this high-risk pregnancy and has Supervision of high risk pregnancy, antepartum, third trimester; Previous cesarean delivery, antepartum; Advanced maternal age in multigravida; Rh negative state in antepartum period, third trimester; Gestational diabetes mellitus in third trimester; Anemia of pregnancy in third trimester; and Unwanted fertility on their problem list.  Patient reports no complaints.  Contractions: Not present. Vag. Bleeding: None.  Movement: Present. Denies any leaking of fluid.   The following portions of the patient's history were reviewed and updated as appropriate: allergies, current medications, past family history, past medical history, past social history, past surgical history and problem list.   Objective:   Vitals:   12/31/18 0843  BP: 119/85  Pulse: (!) 106    Fetal Status:     Movement: Present     General:  Alert, oriented and cooperative. Patient is in no acute distress.  Respiratory: Normal respiratory effort, no problems with respiration noted  Mental Status: Normal mood and affect. Normal behavior. Normal judgment and thought content.  Rest of physical exam  deferred due to type of encounter  Imaging:   Assessment and Plan:  Pregnancy: G3P1011 at [redacted]w[redacted]d  1. Unwanted fertility For BTL, papers signed 10/29/18  2. Supervision of high risk pregnancy, antepartum, third trimester Plans inpatient circumcision  3. Previous cesarean delivery, antepartum Has RCS scheduled for 01/12/19  4. Multigravida of advanced maternal age in third trimester  5. Diet controlled gestational diabetes mellitus (GDM) in third trimester FG: reports all have been within range, except one that was not a true fasting PP: all within range Cont diet control  6. Rh negative state in antepartum period, third trimester S/p Rho gam  7. Anemia of pregnancy in third trimester S/p IV iron x2   Term labor symptoms and general obstetric precautions including but not limited to vaginal bleeding, contractions, leaking of fluid and fetal movement were reviewed in detail with the patient. I discussed the assessment and treatment plan with the patient. The patient was provided an opportunity to ask questions and all were answered. The patient agreed with the plan and demonstrated an understanding of the instructions. The patient was advised to call back or seek an in-person office evaluation/go to MAU at [facility] for any urgent or concerning symptoms. Please refer to After Visit Summary for other counseling recommendations.   I provided 15 minutes of face-to-face time during this encounter.  Return in about 1 week (around 01/07/2019) for OB visit (MD), virtual.  Future Appointments  Date Time Provider [place]  01/10/2019  8:30 AM MC-MAU 1 MC-INDC None    Sloan Leiter, [place] for [facility], [place]

## 2018-12-31 NOTE — Progress Notes (Signed)
I connected with  Alexis Walters on 12/31/18 at  8:35 AM EDT by telephone and verified that I am speaking with the correct person using two identifiers.   I discussed the limitations, risks, security and privacy concerns of performing an evaluation and management service by telephone and the availability of in person appointments. I also discussed with the patient that there may be a patient responsible charge related to this service. The patient expressed understanding and agreed to proceed.  Derinda Late, RN 12/31/2018  8:39 AM

## 2019-01-07 ENCOUNTER — Telehealth (INDEPENDENT_AMBULATORY_CARE_PROVIDER_SITE_OTHER): Payer: Medicaid Other | Admitting: Obstetrics & Gynecology

## 2019-01-07 ENCOUNTER — Other Ambulatory Visit: Payer: Self-pay

## 2019-01-07 DIAGNOSIS — O0993 Supervision of high risk pregnancy, unspecified, third trimester: Secondary | ICD-10-CM

## 2019-01-07 DIAGNOSIS — Z3009 Encounter for other general counseling and advice on contraception: Secondary | ICD-10-CM

## 2019-01-07 DIAGNOSIS — Z3A38 38 weeks gestation of pregnancy: Secondary | ICD-10-CM

## 2019-01-07 DIAGNOSIS — O09523 Supervision of elderly multigravida, third trimester: Secondary | ICD-10-CM

## 2019-01-07 DIAGNOSIS — O34219 Maternal care for unspecified type scar from previous cesarean delivery: Secondary | ICD-10-CM

## 2019-01-07 NOTE — Progress Notes (Signed)
   TELEHEALTH VIRTUAL OBSTETRICS VISIT ENCOUNTER NOTE  I connected with Josem Kaufmann on 01/07/19 at 10:55 AM EDT by telephone at home and verified that I am speaking with the correct person using two identifiers.   I discussed the limitations, risks, security and privacy concerns of performing an evaluation and management service by telephone and the availability of in person appointments. I also discussed with the patient that there may be a patient responsible charge related to this service. The patient expressed understanding and agreed to proceed.  Subjective:  TONY SCHURIG is a 38 y.o. G3P1011 at [redacted]w[redacted]d being followed for ongoing prenatal care.  She is currently monitored for the following issues for this low-risk pregnancy and has Supervision of high risk pregnancy, antepartum, third trimester; Previous cesarean delivery, antepartum; Advanced maternal age in multigravida; Rh negative state in antepartum period, third trimester; Gestational diabetes mellitus in third trimester; Anemia of pregnancy in third trimester; and Unwanted fertility on their problem list.  Patient reports no complaints. Reports fetal movement. Denies any contractions, bleeding or leaking of fluid.   The following portions of the patient's history were reviewed and updated as appropriate: allergies, current medications, past family history, past medical history, past social history, past surgical history and problem list.   Objective:   General:  Alert, oriented and cooperative.   Mental Status: Normal mood and affect perceived. Normal judgment and thought content.  Rest of physical exam deferred due to type of encounter  Assessment and Plan:  Pregnancy: G3P1011 at [redacted]w[redacted]d 1. Unwanted fertility BTL planned  2. Supervision of high risk pregnancy, antepartum, third trimester BG nl with diet control  3. Previous cesarean delivery, antepartum RCS BTL  4. Multigravida of advanced maternal age in third trimester    Term labor symptoms and general obstetric precautions including but not limited to vaginal bleeding, contractions, leaking of fluid and fetal movement were reviewed in detail with the patient.  I discussed the assessment and treatment plan with the patient. The patient was provided an opportunity to ask questions and all were answered. The patient agreed with the plan and demonstrated an understanding of the instructions. The patient was advised to call back or seek an in-person office evaluation/go to MAU at [facility] for any urgent or concerning symptoms. Please refer to After Visit Summary for other counseling recommendations.   I provided 12 minutes of non-face-to-face time during this encounter.  Return if symptoms worsen or fail to improve, for postop.  Future Appointments  Date Time Provider [place]  01/10/2019  8:30 AM MC-MAU 1 MC-INDC None    Emeterio Reeve, [place] for [facility], [place]

## 2019-01-07 NOTE — Patient Instructions (Signed)

## 2019-01-07 NOTE — Progress Notes (Signed)
Pt has paper logs for Glucose readings.

## 2019-01-10 ENCOUNTER — Other Ambulatory Visit (HOSPITAL_COMMUNITY)
Admission: RE | Admit: 2019-01-10 | Discharge: 2019-01-10 | Disposition: A | Discharge status: Home / Self Care | Payer: Medicaid Other | Source: Ambulatory Visit | Attending: Obstetrics and Gynecology | Admitting: Obstetrics and Gynecology

## 2019-01-10 ENCOUNTER — Other Ambulatory Visit: Payer: Self-pay

## 2019-01-10 DIAGNOSIS — Z01812 Encounter for preprocedural laboratory examination: Secondary | ICD-10-CM | POA: Insufficient documentation

## 2019-01-10 DIAGNOSIS — Z20828 Contact with and (suspected) exposure to other viral communicable diseases: Secondary | ICD-10-CM | POA: Diagnosis not present

## 2019-01-10 HISTORY — DX: Gestational diabetes mellitus in pregnancy, unspecified control: O24.419

## 2019-01-10 LAB — CBC
HCT: 38.2 % (ref 36.0–46.0)
Hemoglobin: 12.2 g/dL (ref 12.0–15.0)
MCH: 27.8 pg (ref 26.0–34.0)
MCHC: 31.9 g/dL (ref 30.0–36.0)
MCV: 87 fL (ref 80.0–100.0)
Platelets: 211 10*3/uL (ref 150–400)
RBC: 4.39 MIL/uL (ref 3.87–5.11)
RDW: 21.1 % — ABNORMAL HIGH (ref 11.5–15.5)
WBC: 6.5 10*3/uL (ref 4.0–10.5)
nRBC: 0 % (ref 0.0–0.2)

## 2019-01-10 LAB — RPR: RPR Ser Ql: NONREACTIVE

## 2019-01-10 LAB — SARS CORONAVIRUS 2 (TAT 6-24 HRS): SARS Coronavirus 2: NEGATIVE

## 2019-01-10 NOTE — MAU Note (Signed)
Pt here for PAT covid swab and lab draw. Pt denies any symptoms. Swab collected. Informed pt to leave on blood bank bracelet, pt verbalizes understanding.

## 2019-01-12 ENCOUNTER — Inpatient Hospital Stay (HOSPITAL_COMMUNITY): Payer: Medicaid Other | Admitting: Anesthesiology

## 2019-01-12 ENCOUNTER — Inpatient Hospital Stay (HOSPITAL_COMMUNITY)
Admission: RE | Admit: 2019-01-12 | Discharge: 2019-01-15 | DRG: 785 | Disposition: A | Discharge status: Home / Self Care | Payer: Medicaid Other | Attending: Obstetrics and Gynecology | Admitting: Obstetrics and Gynecology

## 2019-01-12 ENCOUNTER — Other Ambulatory Visit: Payer: Self-pay

## 2019-01-12 ENCOUNTER — Encounter (HOSPITAL_COMMUNITY): Payer: Self-pay | Admitting: *Deleted

## 2019-01-12 ENCOUNTER — Encounter (HOSPITAL_COMMUNITY)
Admission: RE | Disposition: A | Discharge status: Home / Self Care | Payer: Self-pay | Source: Home / Self Care | Attending: Obstetrics and Gynecology

## 2019-01-12 DIAGNOSIS — Z98891 History of uterine scar from previous surgery: Secondary | ICD-10-CM

## 2019-01-12 DIAGNOSIS — D252 Subserosal leiomyoma of uterus: Secondary | ICD-10-CM | POA: Diagnosis present

## 2019-01-12 DIAGNOSIS — L910 Hypertrophic scar: Secondary | ICD-10-CM | POA: Diagnosis present

## 2019-01-12 DIAGNOSIS — O34211 Maternal care for low transverse scar from previous cesarean delivery: Secondary | ICD-10-CM | POA: Diagnosis present

## 2019-01-12 DIAGNOSIS — Z3A39 39 weeks gestation of pregnancy: Secondary | ICD-10-CM

## 2019-01-12 DIAGNOSIS — O9972 Diseases of the skin and subcutaneous tissue complicating childbirth: Secondary | ICD-10-CM | POA: Diagnosis present

## 2019-01-12 DIAGNOSIS — Z6791 Unspecified blood type, Rh negative: Secondary | ICD-10-CM

## 2019-01-12 DIAGNOSIS — Z302 Encounter for sterilization: Secondary | ICD-10-CM

## 2019-01-12 DIAGNOSIS — O3413 Maternal care for benign tumor of corpus uteri, third trimester: Secondary | ICD-10-CM | POA: Diagnosis present

## 2019-01-12 DIAGNOSIS — O360130 Maternal care for anti-D [Rh] antibodies, third trimester, not applicable or unspecified: Secondary | ICD-10-CM

## 2019-01-12 DIAGNOSIS — O26893 Other specified pregnancy related conditions, third trimester: Secondary | ICD-10-CM | POA: Diagnosis present

## 2019-01-12 DIAGNOSIS — O24419 Gestational diabetes mellitus in pregnancy, unspecified control: Secondary | ICD-10-CM | POA: Diagnosis present

## 2019-01-12 DIAGNOSIS — O24420 Gestational diabetes mellitus in childbirth, diet controlled: Secondary | ICD-10-CM | POA: Diagnosis present

## 2019-01-12 DIAGNOSIS — Z3009 Encounter for other general counseling and advice on contraception: Secondary | ICD-10-CM | POA: Diagnosis present

## 2019-01-12 LAB — GLUCOSE, CAPILLARY
Glucose-Capillary: 88 mg/dL (ref 70–99)
Glucose-Capillary: 81 mg/dL (ref 70–99)

## 2019-01-12 SURGERY — Surgical Case
Anesthesia: Spinal

## 2019-01-12 MED ORDER — DIPHENHYDRAMINE HCL 25 MG PO CAPS: 25.0000 mg | ORAL_CAPSULE | ORAL | Status: DC | PRN

## 2019-01-12 MED ORDER — OXYCODONE HCL 5 MG PO TABS
5.0000 mg | ORAL_TABLET | ORAL | Status: DC | PRN
  Administered 2019-01-15: 11:00:00 5 mg via ORAL
  Filled 2019-01-12: qty 1

## 2019-01-12 MED ORDER — SODIUM CHLORIDE 0.9 % IV SOLN
INTRAVENOUS | Status: DC | PRN
  Administered 2019-01-12: 10:00:00 via INTRAVENOUS

## 2019-01-12 MED ORDER — PHENYLEPHRINE HCL-NACL 20-0.9 MG/250ML-% IV SOLN
INTRAVENOUS | Status: AC
  Filled 2019-01-12: qty 250

## 2019-01-12 MED ORDER — KETOROLAC TROMETHAMINE 15 MG/ML IJ SOLN
15.0000 mg | Freq: Four times a day (QID) | INTRAMUSCULAR | Status: DC | PRN
  Administered 2019-01-12 – 2019-01-13 (×3): 15 mg via INTRAVENOUS
  Filled 2019-01-12 (×5): qty 1

## 2019-01-12 MED ORDER — CEFAZOLIN SODIUM-DEXTROSE 2-4 GM/100ML-% IV SOLN
INTRAVENOUS | Status: AC
  Filled 2019-01-12: qty 100

## 2019-01-12 MED ORDER — MENTHOL 3 MG MT LOZG: 1.0000 | LOZENGE | OROMUCOSAL | Status: DC | PRN

## 2019-01-12 MED ORDER — CEFAZOLIN SODIUM-DEXTROSE 2-4 GM/100ML-% IV SOLN
2.0000 g | Freq: Once | INTRAVENOUS | Status: AC
  Administered 2019-01-12: 10:00:00 2 g via INTRAVENOUS

## 2019-01-12 MED ORDER — SCOPOLAMINE 1 MG/3DAYS TD PT72
MEDICATED_PATCH | TRANSDERMAL | Status: AC
  Filled 2019-01-12: qty 1

## 2019-01-12 MED ORDER — DIPHENHYDRAMINE HCL 25 MG PO CAPS: 25.0000 mg | ORAL_CAPSULE | Freq: Four times a day (QID) | ORAL | Status: DC | PRN

## 2019-01-12 MED ORDER — MEPERIDINE HCL 25 MG/ML IJ SOLN: 6.2500 mg | INTRAMUSCULAR | Status: DC | PRN

## 2019-01-12 MED ORDER — LACTATED RINGERS IV SOLN
INTRAVENOUS | Status: DC
  Administered 2019-01-12: 17:00:00 via INTRAVENOUS

## 2019-01-12 MED ORDER — SCOPOLAMINE 1 MG/3DAYS TD PT72
1.0000 | MEDICATED_PATCH | Freq: Once | TRANSDERMAL | Status: AC
  Administered 2019-01-12: 11:00:00 1.5 mg via TRANSDERMAL

## 2019-01-12 MED ORDER — MORPHINE SULFATE (PF) 0.5 MG/ML IJ SOLN
INTRAMUSCULAR | Status: DC | PRN
  Administered 2019-01-12: .15 mg via INTRATHECAL

## 2019-01-12 MED ORDER — NALBUPHINE HCL 10 MG/ML IJ SOLN
5.0000 mg | INTRAMUSCULAR | Status: DC | PRN
  Administered 2019-01-12: 5 mg via INTRAVENOUS
  Filled 2019-01-12: qty 0.5
  Filled 2019-01-12: qty 1

## 2019-01-12 MED ORDER — NALBUPHINE HCL 10 MG/ML IJ SOLN
5.0000 mg | INTRAMUSCULAR | Status: DC | PRN
  Filled 2019-01-12: qty 0.5

## 2019-01-12 MED ORDER — DIBUCAINE (PERIANAL) 1 % EX OINT: 1.0000 "application " | TOPICAL_OINTMENT | CUTANEOUS | Status: DC | PRN

## 2019-01-12 MED ORDER — TETANUS-DIPHTH-ACELL PERTUSSIS 5-2.5-18.5 LF-MCG/0.5 IM SUSP: 0.5000 mL | Freq: Once | INTRAMUSCULAR | Status: DC

## 2019-01-12 MED ORDER — DIPHENHYDRAMINE HCL 50 MG/ML IJ SOLN: 12.5000 mg | INTRAMUSCULAR | Status: DC | PRN

## 2019-01-12 MED ORDER — OXYCODONE HCL 5 MG PO TABS: 5.0000 mg | ORAL_TABLET | Freq: Once | ORAL | Status: DC | PRN

## 2019-01-12 MED ORDER — BUPIVACAINE IN DEXTROSE 0.75-8.25 % IT SOLN
INTRATHECAL | Status: DC | PRN
  Administered 2019-01-12: 1.6 mg via INTRATHECAL

## 2019-01-12 MED ORDER — PRENATAL MULTIVITAMIN CH
1.0000 | ORAL_TABLET | Freq: Every day | ORAL | Status: DC
  Administered 2019-01-12 – 2019-01-15 (×4): 1 via ORAL
  Filled 2019-01-12 (×4): qty 1

## 2019-01-12 MED ORDER — OXYTOCIN 40 UNITS IN NORMAL SALINE INFUSION - SIMPLE MED
INTRAVENOUS | Status: AC
  Filled 2019-01-12: qty 1000

## 2019-01-12 MED ORDER — IBUPROFEN 600 MG PO TABS
600.0000 mg | ORAL_TABLET | Freq: Four times a day (QID) | ORAL | Status: AC
  Administered 2019-01-13 – 2019-01-15 (×7): 600 mg via ORAL
  Filled 2019-01-12 (×8): qty 1

## 2019-01-12 MED ORDER — NALOXONE HCL 4 MG/10ML IJ SOLN
1.0000 ug/kg/h | INTRAVENOUS | Status: DC | PRN
  Filled 2019-01-12: qty 5

## 2019-01-12 MED ORDER — FENTANYL CITRATE (PF) 100 MCG/2ML IJ SOLN: 25.0000 ug | INTRAMUSCULAR | Status: DC | PRN

## 2019-01-12 MED ORDER — SIMETHICONE 80 MG PO CHEW
80.0000 mg | CHEWABLE_TABLET | Freq: Three times a day (TID) | ORAL | Status: DC
  Administered 2019-01-12 – 2019-01-15 (×9): 80 mg via ORAL
  Filled 2019-01-12 (×9): qty 1

## 2019-01-12 MED ORDER — SODIUM CHLORIDE 0.9 % IV SOLN
INTRAVENOUS | Status: DC | PRN
  Administered 2019-01-12: 40 [IU] via INTRAVENOUS

## 2019-01-12 MED ORDER — LACTATED RINGERS IV SOLN
INTRAVENOUS | Status: DC
  Administered 2019-01-12 (×2): via INTRAVENOUS

## 2019-01-12 MED ORDER — WITCH HAZEL-GLYCERIN EX PADS: 1.0000 "application " | MEDICATED_PAD | CUTANEOUS | Status: DC | PRN

## 2019-01-12 MED ORDER — NALOXONE HCL 0.4 MG/ML IJ SOLN: 0.4000 mg | INTRAMUSCULAR | Status: DC | PRN

## 2019-01-12 MED ORDER — PHENYLEPHRINE HCL-NACL 20-0.9 MG/250ML-% IV SOLN
INTRAVENOUS | Status: DC | PRN
  Administered 2019-01-12: 60 ug/min via INTRAVENOUS

## 2019-01-12 MED ORDER — COCONUT OIL OIL: 1.0000 "application " | TOPICAL_OIL | Status: DC | PRN

## 2019-01-12 MED ORDER — ONDANSETRON HCL 4 MG/2ML IJ SOLN
INTRAMUSCULAR | Status: DC | PRN
  Administered 2019-01-12: 4 mg via INTRAVENOUS

## 2019-01-12 MED ORDER — SODIUM CHLORIDE 0.9 % IR SOLN
Status: DC | PRN
  Administered 2019-01-12: 1

## 2019-01-12 MED ORDER — LACTATED RINGERS IV SOLN
INTRAVENOUS | Status: DC | PRN
  Administered 2019-01-12 (×2): via INTRAVENOUS

## 2019-01-12 MED ORDER — FENTANYL CITRATE (PF) 100 MCG/2ML IJ SOLN
INTRAMUSCULAR | Status: AC
  Filled 2019-01-12: qty 2

## 2019-01-12 MED ORDER — SODIUM CHLORIDE 0.9% FLUSH: 3.0000 mL | INTRAVENOUS | Status: DC | PRN

## 2019-01-12 MED ORDER — OXYCODONE HCL 5 MG/5ML PO SOLN: 5.0000 mg | Freq: Once | ORAL | Status: DC | PRN

## 2019-01-12 MED ORDER — FENTANYL CITRATE (PF) 100 MCG/2ML IJ SOLN
INTRAMUSCULAR | Status: DC | PRN
  Administered 2019-01-12: 15 ug via INTRATHECAL

## 2019-01-12 MED ORDER — NALBUPHINE HCL 10 MG/ML IJ SOLN
5.0000 mg | Freq: Once | INTRAMUSCULAR | Status: DC | PRN
  Filled 2019-01-12: qty 0.5

## 2019-01-12 MED ORDER — POLYETHYLENE GLYCOL 3350 17 G PO PACK
17.0000 g | PACK | Freq: Every day | ORAL | Status: DC
  Administered 2019-01-12 – 2019-01-13 (×2): 17 g via ORAL
  Filled 2019-01-12 (×2): qty 1

## 2019-01-12 MED ORDER — ONDANSETRON HCL 4 MG/2ML IJ SOLN
INTRAMUSCULAR | Status: AC
  Filled 2019-01-12: qty 2

## 2019-01-12 MED ORDER — ACETAMINOPHEN 325 MG PO TABS
650.0000 mg | ORAL_TABLET | Freq: Four times a day (QID) | ORAL | Status: DC
  Administered 2019-01-12 – 2019-01-15 (×13): 650 mg via ORAL
  Filled 2019-01-12 (×13): qty 2

## 2019-01-12 MED ORDER — OXYTOCIN 40 UNITS IN NORMAL SALINE INFUSION - SIMPLE MED: 2.5000 [IU]/h | INTRAVENOUS | Status: AC

## 2019-01-12 MED ORDER — ZOLPIDEM TARTRATE 5 MG PO TABS: 5.0000 mg | ORAL_TABLET | Freq: Every evening | ORAL | Status: DC | PRN

## 2019-01-12 MED ORDER — ENOXAPARIN SODIUM 40 MG/0.4ML ~~LOC~~ SOLN
40.0000 mg | SUBCUTANEOUS | Status: DC
  Administered 2019-01-13 – 2019-01-15 (×3): 40 mg via SUBCUTANEOUS
  Filled 2019-01-12 (×3): qty 0.4

## 2019-01-12 MED ORDER — ONDANSETRON HCL 4 MG/2ML IJ SOLN: 4.0000 mg | Freq: Once | INTRAMUSCULAR | Status: DC | PRN

## 2019-01-12 MED ORDER — ONDANSETRON HCL 4 MG/2ML IJ SOLN: 4.0000 mg | Freq: Three times a day (TID) | INTRAMUSCULAR | Status: DC | PRN

## 2019-01-12 MED ORDER — MORPHINE SULFATE (PF) 0.5 MG/ML IJ SOLN
INTRAMUSCULAR | Status: AC
  Filled 2019-01-12: qty 10

## 2019-01-12 SURGICAL SUPPLY — 45 items
APL SKNCLS STERI-STRIP NONHPOA (GAUZE/BANDAGES/DRESSINGS) ×1
BENZOIN TINCTURE PRP APPL 2/3 (GAUZE/BANDAGES/DRESSINGS) ×3 IMPLANT
CANISTER SUCT 3000ML PPV (MISCELLANEOUS) ×3 IMPLANT
CHLORAPREP W/TINT 26ML (MISCELLANEOUS) ×3 IMPLANT
CLOSURE WOUND 1/2 X4 (GAUZE/BANDAGES/DRESSINGS) ×1
DRAPE C SECTION CLR SCREEN (DRAPES) ×2 IMPLANT
DRSG OPSITE POSTOP 4X10 (GAUZE/BANDAGES/DRESSINGS) ×3 IMPLANT
ELECT REM PT RETURN 9FT ADLT (ELECTROSURGICAL) ×3
ELECTRODE REM PT RTRN 9FT ADLT (ELECTROSURGICAL) ×1 IMPLANT
EXTRACTOR VACUUM KIWI (MISCELLANEOUS) ×3 IMPLANT
GAUZE SPONGE 4X4 12PLY STRL LF (GAUZE/BANDAGES/DRESSINGS) ×4 IMPLANT
GLOVE BIOGEL PI IND STRL 7.0 (GLOVE) ×2 IMPLANT
GLOVE BIOGEL PI INDICATOR 7.0 (GLOVE) ×4
GLOVE INDICATOR 7.5 STRL GRN (GLOVE) ×3 IMPLANT
GLOVE SKINSENSE NS SZ7.0 (GLOVE) ×2
GLOVE SKINSENSE STRL SZ7.0 (GLOVE) ×1 IMPLANT
GOWN STRL REUS W/ TWL LRG LVL3 (GOWN DISPOSABLE) ×2 IMPLANT
GOWN STRL REUS W/ TWL XL LVL3 (GOWN DISPOSABLE) ×1 IMPLANT
GOWN STRL REUS W/TWL LRG LVL3 (GOWN DISPOSABLE) ×6
GOWN STRL REUS W/TWL XL LVL3 (GOWN DISPOSABLE) ×3
NS IRRIG 1000ML POUR BTL (IV SOLUTION) ×3 IMPLANT
PACK C SECTION WH (CUSTOM PROCEDURE TRAY) ×3 IMPLANT
PAD ABD 7.5X8 STRL (GAUZE/BANDAGES/DRESSINGS) ×3 IMPLANT
PAD ABD DERMACEA PRESS 5X9 (GAUZE/BANDAGES/DRESSINGS) ×2 IMPLANT
PAD OB MATERNITY 4.3X12.25 (PERSONAL CARE ITEMS) ×3 IMPLANT
PAD PREP 24X48 CUFFED NSTRL (MISCELLANEOUS) ×3 IMPLANT
PENCIL SMOKE EVAC W/HOLSTER (ELECTROSURGICAL) ×3 IMPLANT
STAPLER VISISTAT 35W (STAPLE) ×2 IMPLANT
STRIP CLOSURE SKIN 1/2X4 (GAUZE/BANDAGES/DRESSINGS) ×2 IMPLANT
SUT MNCRL 0 VIOLET CTX 36 (SUTURE) ×2 IMPLANT
SUT MON AB 4-0 PS1 27 (SUTURE) ×3 IMPLANT
SUT MONOCRYL 0 CTX 36 (SUTURE) ×4
SUT PLAIN 0 NONE (SUTURE) IMPLANT
SUT PLAIN 2 0 (SUTURE) ×3
SUT PLAIN 2 0 XLH (SUTURE) ×3 IMPLANT
SUT PLAIN ABS 2-0 CT1 27XMFL (SUTURE) IMPLANT
SUT VIC AB 0 CT1 27 (SUTURE) ×6
SUT VIC AB 0 CT1 27XBRD ANBCTR (SUTURE) ×2 IMPLANT
SUT VIC AB 3-0 CT1 27 (SUTURE) ×3
SUT VIC AB 3-0 CT1 TAPERPNT 27 (SUTURE) ×1 IMPLANT
SUT VICRYL 2 0 18  TIES (SUTURE) ×2
SUT VICRYL 2 0 18 TIES (SUTURE) IMPLANT
TAPE CLOTH SURG 4X10 WHT LF (GAUZE/BANDAGES/DRESSINGS) ×2 IMPLANT
TOWEL OR 17X24 6PK STRL BLUE (TOWEL DISPOSABLE) ×6 IMPLANT
WATER STERILE IRR 1000ML POUR (IV SOLUTION) ×3 IMPLANT

## 2019-01-12 NOTE — Discharge Summary (Addendum)
Postpartum Discharge Summary     Patient Name: Alexis Walters DOB: 1980/11/24 MRN: [id number]  Date of admission: 01/12/2019 Delivering Provider: Aletha Halim   Date of discharge: 01/15/2019  Admitting diagnosis: 39/0. Scheduled rpt c/s and btl Intrauterine pregnancy: [redacted]w[redacted]d    Secondary diagnosis:  Gdma1, ama     Discharge diagnosis: Term Pregnancy Delivered and GDM A1                                                                                                Post partum procedures:None  Augmentation: None  Complications: None  Hospital course:  [name]   38y.o. yo G3P1011 at 331w0das admitted to the hospital 01/12/2019 for scheduled cesarean section and btl and underwent that procedure.  Membrane Rupture Time/Date: 10:04 AM ,01/12/2019   Patient delivered a Viable infant.01/12/2019  Details of operation can be found in separate operative note.  Pateint had an uncomplicated postpartum course.  She is ambulating, tolerating a regular diet, passing flatus, and urinating well. Patient is discharged home in stable condition on  01/15/19. Patient received BTL during c-section. Patient with keloid which was incised with c-section. Staples placed and removed on POD#3; steri-strips and honeycomb applied after removal. POC glucose appropriate post-partum. Patient verbalized understanding of discharge and follow-up instructions and was ambulating without assistance upon discharge.        Delivery time: 10:04 AM    Magnesium Sulfate received: No BMZ received: No Rhophylac:No; baby Rh neg MMR:No Transfusion:No  Physical exam  Vitals:   01/14/19 0500 01/14/19 1416 01/14/19 2120 01/15/19 0500  BP: 127/85 120/78 122/79 118/77  Pulse: 87 82 92 (!) 101  Resp: '16 20 18 20  ' Temp: 98.7 F (37.1 C) 97.9 F (36.6 C) 98.5 F (36.9 C) 98.3 F (36.8 C)  TempSrc: Oral Oral Oral Oral  SpO2: 100% 100%  99%  Weight:      Height:       General: alert, cooperative and no  distress Lochia: appropriate Uterine Fundus: firm Incision: Healing well with no significant drainage, No significant erythema, Dressing is clean, dry, and intact DVT Evaluation: No evidence of DVT seen on physical exam. No cords or calf tenderness. Labs: Lab Results  Component Value Date   WBC 7.5 01/13/2019   HGB 10.8 (L) 01/13/2019   HCT 33.6 (L) 01/13/2019   MCV 88.2 01/13/2019   PLT 180 01/13/2019   CMP Latest Ref Rng & Units 01/13/2019  Glucose 70 - 99 mg/dL -  BUN 6 - 20 mg/dL -  Creatinine 0.44 - 1.00 mg/dL 0.66  Sodium 135 - 145 mmol/L -  Potassium 3.5 - 5.1 mmol/L -  Chloride 98 - 111 mmol/L -  CO2 22 - 32 mmol/L -  Calcium 8.9 - 10.3 mg/dL -  Total Protein 6.5 - 8.1 g/dL -  Total Bilirubin 0.3 - 1.2 mg/dL -  Alkaline Phos 38 - 126 U/L -  AST 15 - 41 U/L -  ALT 0 - 44 U/L -    Discharge instruction: per After Visit Summary and "Baby and Me Booklet".  After  visit meds:  Allergies as of 01/15/2019   No Known Allergies     Medication List    STOP taking these medications   Accu-Chek FastClix Lancets Misc   glucose blood test strip     TAKE these medications   ferrous sulfate 324 MG Tbec Take 324 mg by mouth daily with breakfast.   multivitamin-prenatal 27-0.8 MG Tabs tablet Take 1 tablet by mouth daily at 12 noon.   oxyCODONE 5 MG immediate release tablet Commonly known as: Oxy IR/ROXICODONE Take 1-2 tablets (5-10 mg total) by mouth every 4 (four) hours as needed for moderate pain.       Diet: carb modified diet  Activity: Advance as tolerated. Pelvic rest for 6 weeks.   Outpatient follow up:4 weeks Follow up Appt: Future Appointments  Date Time Provider [place]  02/18/2019  2:15 PM Luvenia Redden, PA-C [facility] WOC   Follow up Visit:   Please schedule this patient for Postpartum visit in: 4 weeks with the following provider: Any provider For C/S patients schedule nurse incision check in weeks 2 weeks: yes High risk pregnancy  complicated by: GDM Delivery mode:  CS Anticipated Birth Control:  BTL done during c-section PP Procedures needed: incision check and GTT  Schedule Integrated BH visit: no   Newborn Data: Live born female  Birth Weight: 2850g APGAR: 96, 9  Newborn Delivery   Birth date/time: 01/12/2019 10:04:00 Delivery type: C-Section, Low Transverse Trial of labor: No C-section categorization: Repeat      Baby Feeding: Both Disposition:home with mother   01/15/2019 Chauncey Mann, MD

## 2019-01-12 NOTE — Anesthesia Postprocedure Evaluation (Signed)
Anesthesia Post Note  Patient: Alexis Walters  Procedure(s) Performed: CESAREAN SECTION WITH BILATERAL TUBAL LIGATION (N/A )     Patient location during evaluation: PACU Anesthesia Type: Spinal Level of consciousness: oriented and awake and alert Pain management: pain level controlled Vital Signs Assessment: post-procedure vital signs reviewed and stable Respiratory status: spontaneous breathing, respiratory function stable and nonlabored ventilation Cardiovascular status: blood pressure returned to baseline and stable Postop Assessment: no headache, no backache, no apparent nausea or vomiting and spinal receding Anesthetic complications: no    Last Vitals:  Vitals:   01/12/19 1230 01/12/19 1318  BP:  111/79  Pulse:  75  Resp: 18 18  Temp: 36.5 C (!) 36.3 C  SpO2:  100%    Last Pain:  Vitals:   01/12/19 1318  TempSrc: Oral   Pain Goal:                   Lidia Collum

## 2019-01-12 NOTE — Anesthesia Preprocedure Evaluation (Addendum)
Anesthesia Evaluation  Patient identified by MRN, date of birth, ID band Patient awake    Reviewed: Allergy & Precautions, NPO status , Patient's Chart, lab work & pertinent test results  History of Anesthesia Complications Negative for: history of anesthetic complications  Airway Mallampati: II  TM Distance: >3 FB Neck ROM: Full    Dental   Pulmonary neg pulmonary ROS,    Pulmonary exam normal        Cardiovascular negative cardio ROS Normal cardiovascular exam     Neuro/Psych negative neurological ROS  negative psych ROS   GI/Hepatic negative GI ROS, Neg liver ROS,   Endo/Other  diabetes, Gestational  Renal/GU negative Renal ROS  negative genitourinary   Musculoskeletal negative musculoskeletal ROS (+)   Abdominal   Peds  Hematology negative hematology ROS (+)   Anesthesia Other Findings Prior C/S x1  Reproductive/Obstetrics (+) Pregnancy                            Anesthesia Physical Anesthesia Plan  ASA: II  Anesthesia Plan: Spinal   Post-op Pain Management:    Induction:   PONV Risk Score and Plan: 3 and Ondansetron and Treatment may vary due to age or medical condition  Airway Management Planned: Natural Airway  Additional Equipment: None  Intra-op Plan:   Post-operative Plan:   Informed Consent: I have reviewed the patients History and Physical, chart, labs and discussed the procedure including the risks, benefits and alternatives for the proposed anesthesia with the patient or authorized representative who has indicated his/her understanding and acceptance.       Plan Discussed with:   Anesthesia Plan Comments:        Anesthesia Quick Evaluation

## 2019-01-12 NOTE — Op Note (Signed)
Operative Note   SURGERY DATE: 01/12/2019  PRE-OP DIAGNOSIS:  *Pregnancy at 19/0 *GDMa1 *History of cesarean section *Desire for permanent sterilization *Advanced maternal age  38 DIAGNOSIS: Same. delivered   PROCEDURE: Repeat low transverse cesarean section via pfannenstiel skin incision with double layer uterine closure and bilateral tubal ligation via [place]; excision of prior keloid skin inicision  SURGEON: Surgeon(s) and Role:    * Aletha Halim, MD - Primary  ASSISTANT:    * Fair, Chelsea Delane Ginger, MD - Assisting  ANESTHESIA: spinal  ESTIMATED BLOOD LOSS: 214mL  DRAINS: 348mL UOP via indwelling foley  TOTAL IV FLUIDS: 2332mL crystalloid  VTE PROPHYLAXIS: SCDs to bilateral lower extremities  ANTIBIOTICS: Two grams of Cefazolin were given., within 1 hour of skin incision  SPECIMENS: portions of bilateral tubes to pathology  COMPLICATIONS: none  FINDINGS: No intra-abdominal adhesions were noted. Grossly normal uterus, tubes and ovaries. Clear amniotic fluid, cephalic, female infant, weight 2850gm, APGARs 9/9, intact placenta.  3cm pedunculated fibroid on the anterior-right side, approximately 7cm above the hysterotomy. 2cm subserosal fibroid on the mid anterior side about 3cm above the hysterotomy and another 2cm subserosal fibroid on the posterior side about 4cm past the fundus and on the right side  PROCEDURE IN DETAIL: The patient was taken to the operating room where anesthesia was administered and normal fetal heart tones were confirmed. She was then prepped and draped in the normal fashion in the dorsal supine position with a leftward tilt.  After a time out was performed, the old keloid incision was removed with the scalpel and cautery. Next, a pfannensteil  skin incision was made with the scalpel and carried through to the underlying layer of fascia. The fascia was then incised at the midline and this incision was extended laterally with the mayo scissors. Attention  was turned to the superior aspect of the fascial incision which was grasped with the kocher clamps x 2, tented up and the rectus muscles were dissected off with the scalpel. In a similar fashion the inferior aspect of the fascial incision was grasped with the kocher clamps, tented up and the rectus muscles dissected off with the mayo scissors. The rectus muscles were then separated in the midline and the peritoneum was entered bluntly. The bladder blade was inserted and the vesicouterine peritoneum was identified, tented up and entered with the metzenbaum scissors. This incision was extended laterally and the bladder flap was created digitally. The bladder blade was reinserted.  A low transverse hysterotomy was made with the scalpel until the endometrial cavity was breached and the amniotic sac ruptured with the Allis clamp, yielding clear amniotic fluid. This incision was extended bluntly and the infant's head, shoulders and body were delivered atraumatically.The cord was clamped x 2 and cut, and the infant was handed to the awaiting pediatricians, after delayed cord clamping was done.  The placenta was then gradually expressed from the uterus and then the uterus was exteriorized and cleared of all clots and debris. The hysterotomy was repaired with a running suture of 1-0 monocryl. A second imbricating layer of 1-0 monocryl suture was then placed to achieve excellent hemostasis.  The left Fallopian tube was identified by tracing out to the fimbraie, grasped with the Babcock clamps. An avascular midsection of the tube approximately 3-4cm from the cornua was grasped with the babcock clamps and the distal and proximal aspects were ligated with a suture of 2-0 vicryl, with the intervening portion of tube was transected and removed, via the Metzenbaum scissors. Another  suture tie was then placed below both stumps.  Attention was then turned to the right fallopian tube after confirmation by tracing the tube out to  the fimbriae. The same procedure was then performed on the right Fallopian tube, with excellent hemostasis was noted from both BTL sites.    The uterus and adnexa were then returned to the abdomen, and the hysterotomy and all operative sites were reinspected and excellent hemostasis was noted after irrigation and suction of the abdomen with warm saline.  The fascia was reapproximated with 0 Vicryl in a simple running fashion bilaterally. The subcutaneous layer was then reapproximated with interrupted sutures of 2-0 plain gut, and the skin was then closed staples since they were closed with suture last time with resultant keloid.   The patient  tolerated the procedure well. Sponge, lap, needle, and instrument counts were correct x 2. The patient was transferred to the recovery room awake, alert and breathing independently in stable condition.  Durene Romans MD Attending Center for [facility])

## 2019-01-12 NOTE — Transfer of Care (Signed)
Immediate Anesthesia Transfer of Care Note  Patient: Alexis Walters  Procedure(s) Performed: CESAREAN SECTION WITH BILATERAL TUBAL LIGATION (N/A )  Patient Location: PACU  Anesthesia Type:Spinal  Level of Consciousness: awake  Airway & Oxygen Therapy: Patient Spontanous Breathing  Post-op Assessment: Report given to RN and Post -op Vital signs reviewed and stable  Post vital signs: stable  Last Vitals:  Vitals Value Taken Time  BP 103/56 01/12/19 1103  Temp    Pulse 84 01/12/19 1105  Resp 10 01/12/19 1105  SpO2 100 % 01/12/19 1105  Vitals shown include unvalidated device data.  Last Pain:  Vitals:   01/12/19 0804  TempSrc: Oral         Complications: No apparent anesthesia complications

## 2019-01-12 NOTE — Anesthesia Procedure Notes (Signed)
Spinal  Patient location during procedure: OR Staffing Anesthesiologist: Anson Peddie E, MD Performed: anesthesiologist  Preanesthetic Checklist Completed: patient identified, surgical consent, pre-op evaluation, timeout performed, IV checked, risks and benefits discussed and monitors and equipment checked Spinal Block Patient position: sitting Prep: site prepped and draped and DuraPrep Patient monitoring: continuous pulse ox, blood pressure and heart rate Approach: midline Location: L3-4 Injection technique: single-shot Needle Needle type: Pencan  Needle gauge: 24 G Needle length: 9 cm Additional Notes Functioning IV was confirmed and monitors were applied. Sterile prep and drape, including hand hygiene and sterile gloves were used. The patient was positioned and the spine was prepped. The skin was anesthetized with lidocaine.  Free flow of clear CSF was obtained prior to injecting local anesthetic into the CSF. The needle was carefully withdrawn. The patient tolerated the procedure well.      

## 2019-01-12 NOTE — Lactation Note (Signed)
This note was copied from a baby's chart. Lactation Consultation Note  Patient Name: Alexis Walters [id number] Date: 01/12/2019 Reason for consult: Difficult latch;Term Type of Endocrine Disorder?: Diabetes  Baby is 5 hours old and LC was called by the 5S secretary per RN  To assist mom with difficult latch.  LC offered to assist and assess breast tissue.  LC noted semi - compressible areolas , reviewed hand expressing , no EBM yield. LC placed warm moist burp cloth on moms breast and reverse pressure to the areola and it soften and became more compressible.  1st tried to latch on the right breast / modified laid back and it was on and off.  LC switched the baby's position and at 1st on and off pattern for 15 mins.  LC tried again after burping the baby and he latched and was still feeding at 8 mins.few swallows.  MBURN aware and in the room and will finish the documentation for feeding. LC recommended after warm moist heat - breast massage, hand express, pre-pump to prime the milk ducts and make the Nipple - Areola complex more elastic for a deeper latch. LC plan - continue to work on the latching , mom may need assist to latch.  Shells between feedings except with sleeping,     Mom mentioned 1st her baby he was in NICU and she breast fed short time at home, but baby never really latched well.   Mom expressed appreciation for assistance with latch.     Maternal Data Has patient been taught Hand Expression?: Yes Does the patient have breastfeeding experience prior to this delivery?: Yes  Feeding Feeding Type: Breast Fed  LATCH Score Latch: Grasps breast easily, tongue down, lips flanged, rhythmical sucking.  Audible Swallowing: A few with stimulation  Type of Nipple: Everted at rest and after stimulation  Comfort (Breast/Nipple): Soft / non-tender  Hold (Positioning): Assistance needed to correctly position infant at breast and maintain latch.  LATCH Score:  8  Interventions Interventions: Breast feeding basics reviewed;Assisted with latch;Skin to skin;Breast massage;Hand express;Breast compression;Adjust position;Support pillows;Position options;Shells;Hand pump  Lactation Tools Discussed/Used Tools: Pump Breast pump type: Manual   Consult Status Consult Status: Follow-up Date: 01/13/19 Follow-up type: In-patient    [place] 01/12/2019, 5:03 PM

## 2019-01-12 NOTE — H&P (Signed)
Obstetrics Admission History & Physical  01/12/2019 - 8:46 AM Primary OBGYN: [facility]  Chief Complaint: scheduled c-section  History of Present Illness  38 y.o. G3P1011 @ [redacted]w[redacted]d, with the above CC. Pregnancy complicated by: GDMa1, AMA, prior c-section, BMI 31, Rh neg.  Alexis Walters states that she is doing well and w/o complaint or issue.   Review of Systems: as noted in the History of Present Illness.  Patient Active Problem List   Diagnosis Date Noted  . Unwanted fertility 11/27/2018  . Gestational diabetes mellitus in third trimester 10/30/2018  . Anemia of pregnancy in third trimester 10/30/2018  . Rh negative state in antepartum period, third trimester 10/29/2018  . Supervision of high risk pregnancy, antepartum, third trimester 07/09/2018  . Previous cesarean delivery, antepartum 07/09/2018  . Advanced maternal age in multigravida 07/09/2018     PMHx:  Past Medical History:  Diagnosis Date  . Gestational diabetes   . Medical history non-contributory    PSHx:  Past Surgical History:  Procedure Laterality Date  . CESAREAN SECTION  05/11/2017  . DILATION AND CURETTAGE OF UTERUS  2014   for SAB  . WISDOM TOOTH EXTRACTION  2013   Medications:  Medications Prior to Admission  Medication Sig Dispense Refill Last Dose  . ferrous sulfate 324 MG TBEC Take 324 mg by mouth daily with breakfast.    Past Week at Unknown time  . Prenatal Vit-Fe Fumarate-FA (MULTIVITAMIN-PRENATAL) 27-0.8 MG TABS tablet Take 1 tablet by mouth daily at 12 noon.   01/11/2019 at Unknown time  . Accu-Chek FastClix Lancets MISC 1 each by Percutaneous route 4 (four) times daily. 100 each 12   . glucose blood test strip Use as instructed to check blood sugar 4 times daily 100 each 12      Allergies: has No Known Allergies. OBHx:  OB History  Gravida Para Term Preterm AB Living  3 1 1   1 1   SAB TAB Ectopic Multiple Live Births  1       1    # Outcome Date GA Lbr Len/2nd Weight Sex Delivery Anes PTL  Lv  3 Current           2 Term 05/11/17 [redacted]w[redacted]d  2892 g M CS-LTranv EPI N LIV     Complications: Fetal Intolerance  1 SAB 08/07/13 [redacted]w[redacted]d                    FHx:  Family History  Problem Relation Age of Onset  . Healthy Mother   . Healthy Father    Soc Hx:  Social History   Socioeconomic History  . Marital status: Single    Spouse name: Not on file  . Number of children: Not on file  . Years of education: Not on file  . Highest education level: Not on file  Occupational History    Comment: unemployed  Social Needs  . Financial resource strain: Not on file  . Food insecurity    Worry: Never true    Inability: Never true  . Transportation needs    Medical: No    Non-medical: No  Tobacco Use  . Smoking status: Never Smoker  . Smokeless tobacco: Never Used  Substance and Sexual Activity  . Alcohol use: Never    Frequency: Never    Comment: occasional  . Drug use: No  . Sexual activity: Yes    Birth control/protection: None  Lifestyle  . Physical activity    Days per  week: Not on file    Minutes per session: Not on file  . Stress: Not on file  Relationships  . Social Herbalist on phone: Not on file    Gets together: Not on file    Attends religious service: Not on file    Active member of club or organization: Not on file    Attends meetings of clubs or organizations: Not on file    Relationship status: Not on file  . Intimate partner violence    Fear of current or ex partner: No    Emotionally abused: No    Physically abused: No    Forced sexual activity: No  Other Topics Concern  . Not on file  Social History Narrative  . Not on file    Objective    Current Vital Signs 24h Vital Sign Ranges  T 99 F (37.2 C) Temp  Avg: 99 F (37.2 C)  Min: 99 F (37.2 C)  Max: 99 F (37.2 C)  BP 117/82 BP  Min: 117/82  Max: 117/82  HR (!) 104 Pulse  Avg: 104  Min: 104  Max: 104  RR 16 Resp  Avg: 16  Min: 16  Max: 16  SaO2 100 % Room Air SpO2  Avg: 100 %   Min: 100 %  Max: 100 %       24 Hour I/O Current Shift I/O  Time Ins Outs No intake/output data recorded. No intake/output data recorded.   EFM: 150s   General: Well nourished, well developed female in no acute distress.  Skin:  Warm and dry.  Cardiovascular: S1, S2 normal, no murmur, rub or gallop, regular rate and rhythm Respiratory:  Clear to auscultation bilateral. Normal respiratory effort Abdomen: gravid, nttp. 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  Neuro/Psych:  Normal mood and affect.   Labs  O NEG  Recent Labs  Lab 01/10/19 0900  WBC 6.5  HGB 12.2  HCT 38.2  PLT 211   CBG 88  Radiology Posterior placenta  Assessment & Plan   38 y.o. G3P1011 @ [redacted]w[redacted]d here for scheduled c/s. Pt doing well *Pregnancy: fetal status reassuring *Delivery: rpt c/s and d/w her and she still desires BTL. Permanency etc d/w her. Papers are up to date. Can proceed with rpt c/s and BTL and keloid excision.  *GDMa1: will check CBG *Rh neg: rhogam pp prn *AMA: no issues  Durene Romans MD Attending Center for [facility])

## 2019-01-13 LAB — CBC
HCT: 33.6 % — ABNORMAL LOW (ref 36.0–46.0)
Hemoglobin: 10.8 g/dL — ABNORMAL LOW (ref 12.0–15.0)
MCH: 28.3 pg (ref 26.0–34.0)
MCHC: 32.1 g/dL (ref 30.0–36.0)
MCV: 88.2 fL (ref 80.0–100.0)
Platelets: 180 10*3/uL (ref 150–400)
RBC: 3.81 MIL/uL — ABNORMAL LOW (ref 3.87–5.11)
RDW: 21.2 % — ABNORMAL HIGH (ref 11.5–15.5)
WBC: 7.5 10*3/uL (ref 4.0–10.5)
nRBC: 0 % (ref 0.0–0.2)

## 2019-01-13 LAB — CREATININE, SERUM
Creatinine, Ser: 0.66 mg/dL (ref 0.44–1.00)
GFR calc Af Amer: >60 mL/min (ref >60)
GFR calc non Af Amer: >60 mL/min (ref >60)

## 2019-01-13 NOTE — Lactation Note (Addendum)
This note was copied from a baby's chart. Lactation Consultation Note Baby 19 hrs old. Cueing. Hasn't had much interest in BF.  Mom has short shaft nipples. Edema to breast. Reverse pressure and finger massage to areola and nipples some helpful. Pre-pumping helpful.  Baby will not stay latched. Mom has had nipple piercing, noduals to side of nipples. Fitted #24 NS. To big for baby's mouth. Fitted #20 NS. Baby latched well. Taught mom chin tug. Difficult for her to do. FOB sleeping not any help at this time. Taught "C" hold and BF in football hold. Baby has nasal congestion, breathes better in football hold. Baby off and on for 15 min. NO colostrum, skin dry on nipple. LC unable to hand express except a bare glisten of colostrum. Baby cool to touch d/t RN had been trying to get baby to feed then [place]. Had baby STS. LC placed blanket over baby as feeding. Baby fell asleep.  [place] swaddled baby. D/t mom having GDM, had normal glucose, LC felt that supplementing was needed d/t mom appearing dry and baby hasn't fed much. Mom plans on breast/bottle feeding at home. Mom has [place], [place] gave [place] w/slow flow nipple.  Baby had been spitting several time today and mostly why baby isn't hungry. Abd. Slightly distended.  Newborn behavior, STS, I&O, hand expression, reverse pressure, encouraging mom to wear shells, discussed cluster feeding, pumping,supply and demand discussed. Asked RN to set up DEBP after mom wakes from nap. Mom is very sleepy from all breast stimulation. Mom pumped and bottle fed her 106 month old. Reviewed supplementing amounts and information sheet given. Encouraged to call for questions or assistance. Reported to RN.  Patient Name: Alexis Walters [id number] Date: 01/13/2019 Reason for consult: Mother's request;Difficult latch Type of Endocrine Disorder?: Diabetes   Maternal Data Has patient been taught Hand Expression?: Yes Does the patient have breastfeeding experience prior to this  delivery?: Yes  Feeding Feeding Type: Formula Nipple Type: Slow - flow  LATCH Score Latch: Grasps breast easily, tongue down, lips flanged, rhythmical sucking.  Audible Swallowing: None  Type of Nipple: Everted at rest and after stimulation(short shaft)  Comfort (Breast/Nipple): Soft / non-tender  Hold (Positioning): Full assist, staff holds infant at breast  LATCH Score: 6  Interventions Interventions: Breast feeding basics reviewed;Adjust position;Assisted with latch;Support pillows;Skin to skin;Position options;Breast massage;Hand express;Pre-pump if needed;Reverse pressure;Breast compression;Hand pump  Lactation Tools Discussed/Used Tools: Shells;Pump;Nipple Shields Nipple shield size: 20 Shell Type: Inverted Breast pump type: Manual WIC Program: Yes Pump Review: Milk Storage(asked RN to set up DEBP)   Consult Status Consult Status: Follow-up Date: 01/13/19 Follow-up type: In-patient    [name], [place] 01/13/2019, 3:06 AM

## 2019-01-13 NOTE — Progress Notes (Signed)
Patient requesting circumcision. Encouraged patient and husband to pay for it today. Inquires information regarding circumcision consent by physician to be given in the morning in preparation for possible discharge tomorrow.   Melvyn Neth, RN

## 2019-01-13 NOTE — Plan of Care (Signed)
Discussed with patient, as well as, educated and encouraged on safety, activity, nutrition needs, elimination, and pain management. Patient is continuing to progress in all areas. Youlanda Roys, RN

## 2019-01-13 NOTE — Progress Notes (Addendum)
POD#1 Elective RLTCS, GDMA1  Subjective: Pt doing well. Breast feeding and supplementing with bottle feeding. Pain on standing and on left side of abdomen but well controlled on analgesia. Lochia is appropriate, changed yellow pads X 6 times. Denies clots. Mobilizing, tolerating PO well. No voiding sx. Passing flatus, no BM yet. Denies headache, RUQ pain, chest pain, SOB, cough or fevers. Wants to stay 3 nights in hospital and have staples removed inpatient.  Objective: Blood pressure 115/70, pulse 81, temperature 98 F (36.7 C), temperature source Axillary, resp. rate 16, height 5\' 2"  (1.575 m), weight 78.2 kg, last menstrual period 04/14/2018, SpO2 99 %, currently breastfeeding.  Physical Exam:  General: alert, cooperative and appears stated age  Cardiac: RRR, no rubs or gallops Pulm: chest clear on ausc, no crackles or wheeze Abdo: Abdo soft, tender in pelvic area. No guarding. Bowel sounds present  Lochia: appropriate Uterine Fundus: firm Incision: no significant drainage, no dehiscence DVT Evaluation: No cords or calf tenderness. No significant calf/ankle edema.  Recent Labs    01/10/19 0900 01/13/19 0543  HGB 12.2 10.8*  HCT 38.2 33.6*   Hb 10.8 down from 12.2 pre-op on 9/5  Assessment/Plan: S/p c-section. Doing well post operatively.  Contraception: had BLT on 9/7 Plan for discharge 9/9 or 9/10 Encourage mobilization  Plan to remove staples on 9/10-in office if pt wants to go home POD#2   LOS: 1 day   Alexis Walters 01/13/2019, 8:35 AM

## 2019-01-14 LAB — BPAM RBC
Blood Product Expiration Date: 202009122359
Blood Product Expiration Date: 202009132359
Unit Type and Rh: 9500
Unit Type and Rh: 9500

## 2019-01-14 LAB — TYPE AND SCREEN
ABO/RH(D): O NEG
Antibody Screen: POSITIVE
Unit division: 0

## 2019-01-14 LAB — GLUCOSE, CAPILLARY: Glucose-Capillary: 82 mg/dL (ref 70–99)

## 2019-01-14 NOTE — Lactation Note (Signed)
This note was copied from a baby's chart. Lactation Consultation Note  Patient Name: Alexis Walters [id number] Date: 01/14/2019 Reason for consult: Follow-up assessment   LC Follow Up Visit:  Attempted to visit with mother, however, she was in the shower.  Father was bottle feeding baby.  Spoke with father about  mother regarding her intent to breast feed vs formula feed since I noticed she has not attempted breast feeding since 01/12/19 at 2220.  She has been giving large volumes of formula to baby.  Father stated that she wants to breast feed.  I asked him to have mother call for latch assistance the next time baby is ready to feed.  Expressed the strong desire to work with her since she should be discharged tomorrow.  Father will pass this information to mother.  RN updated and stated she has also discussed this with the family.   Consult Status Consult Status: Follow-up Date: 01/14/19 Follow-up type: In-patient    Alexis Walters [name] 01/14/2019, 1:05 PM

## 2019-01-14 NOTE — Lactation Note (Signed)
This note was copied from a baby's chart. Lactation Consultation Note  Patient Name: Alexis Walters [id number] Date: 01/14/2019   P1, 1 hour female infant. LC entered room mom and infant asleep.   Maternal Data    Feeding    LATCH Score                   Interventions    Lactation Tools Discussed/Used     Consult Status      Vicente Serene 01/14/2019, 2:52 AM

## 2019-01-14 NOTE — Progress Notes (Addendum)
Post Partum Day 2, POD#1 Elective RLTCS, GDMA1 Subjective: Pt doing well. Breast and bottle feeding baby. Lochia is appropriate with light vaginal bleeding. Changed white pads X 2 in last day. Denies passing clots. Mobilizing, tolerating PO, voiding well. No bowel movement yet. Pt still insistent on staying another night and does not want to go home until tomorrow. Would prefer to have her staples removed in hospital.  No chest pain, SOB, dizziness, cough, fevers, headache, RUQ pain or visual disturbances.  Objective: Blood pressure 127/85, pulse 87, temperature 98.7 F (37.1 C), temperature source Oral, resp. rate 16, height 5\' 2"  (1.575 m), weight 78.2 kg, last menstrual period 04/14/2018, SpO2 100 %, currently breastfeeding.  Physical Exam:  General: alert, cooperative and appears stated age  Cardiac: RRR, no rubs or gallops Pulm: chest clear on ausc, no wheezes or crackles, no respiratory distress  GI: Abdo soft, mildly tender in lower abdomen. No guarding. Bowel sounds present  Lochia: appropriate Uterine Fundus: firm Incision: no significant drainage, no dehiscence, no significant erythema DVT Evaluation: No cords or calf tenderness. No significant calf/ankle edema.  Recent Labs    01/13/19 0543  HGB 10.8*  HCT 33.6*    Assessment/Plan: S/p c-section. Doing well post op.  Contraception: had BTL on 9/7 Plan for discharge tomorrow and removal of staples prior to discharge Circumcision for baby prior to discharge   LOS: 2 days   Lattie Haw MD  01/14/2019, 10:55 AM    Provider attestation I have seen and examined this patient and agree with above documentation in the resident's note.   Post Partum Day 2  Alexis Walters is a 38 y.o. G3P1011 s/p RCS/BTL.  Pt denies problems with ambulating, voiding or po intake. Pain is well controlled. Method of Feeding: bottle  PE:  Gen: well appearing Heart: reg rate Lungs: normal WOB Fundus firm Ext: soft, no pain, no  edema  Assessment: S/p RCS/BTL PPD #2  Plan for discharge: tomorrow -pt wants staples removed in hospital, will discharge tomorrow after removal -inpatient circ, patient will pay -fasting CBG this morning = [place], NP 11:20 AM

## 2019-01-14 NOTE — Lactation Note (Signed)
This note was copied from a baby's chart. Lactation Consultation Note  Patient Name: Alexis Walters [id number] Date: 01/14/2019  P1, 61 hour female infant, -6% weight loss. Mom was given DEBP, breast shells. NS 20 mm  and hand pump by Nurse. Per mom, infant was given formula 5 minutes prior to [facility] entering the room infant is not cuing to breastfeed at this time.. Mom did not breastfeed infant during the night  He was only given formula.  Mom would like help with latching infant to breast during the day from her Nurse or [facility] services. Mom did not use DEBP at night she plans to start pumping every 3 hours during the day and wear breast shells as advised earlier by Nurse. Mom will try latch infant first and then supplement with formula.  Mom knows to breastfeed according hunger cues, 8 to times within 24 hours and on demand.  Maternal Data     Feeding    LATCH Score                   Interventions    Lactation Tools Discussed/Used     Consult Status      Vicente Serene 01/14/2019, 6:16 AM

## 2019-01-15 MED ORDER — OXYCODONE HCL 5 MG PO TABS: 5.0000 mg | ORAL_TABLET | ORAL | 0 refills | Status: DC | PRN

## 2019-01-15 NOTE — Lactation Note (Signed)
This note was copied from a baby's chart. Lactation Consultation Note  Patient Name: Alexis Walters Date: 01/15/2019 Reason for consult: Follow-up assessment;Infant weight loss;Other (Comment);Term(4% weight loss) Type of Endocrine Disorder?: Diabetes  Baby is 50 hours hours old / post circ/ baby is sleepy.  Per mom still is working on the breast feeding .  Milk is in and she has to pump.  LC reviewed transitioning baby back to the breast.  LC discussed taking the baby to the breast calm and if needed  Give the baby and appetizer of EBM or formula 10 ml and then latch.  Try without  The NS 1st and if needed use the NS .  LC offered mom to request and LC  O/ P appt if using the NS and mom requested to be able to call herself. LC highly recommended if using the Nipple Shield to latch.  LC reviewed sore nipple and engorgement prevention and tx.  Mom has a hand pump , DEBP kit and a DEBP Spectra at home.  LC stressed the importance of extra post pumping PRN and after at least 5 feedings to protect and establish milk supply.  LC reviewed storage of breast milk and LC resources after D/C.    Maternal Data Has patient been taught Hand Expression?: Yes  Feeding Feeding Type: (per mom last feeding was from a bottle and baby has been sleepy from circ) Nipple Type: Slow - flow  LATCH Score Latch: Repeated attempts needed to sustain latch, nipple held in mouth throughout feeding, stimulation needed to elicit sucking reflex.  Audible Swallowing: A few with stimulation  Type of Nipple: Everted at rest and after stimulation  Comfort (Breast/Nipple): Filling, red/small blisters or bruises, mild/mod discomfort  Hold (Positioning): Assistance needed to correctly position infant at breast and maintain latch.  LATCH Score: 6  Interventions Interventions: Breast feeding basics reviewed;Shells;Hand pump;DEBP  Lactation Tools Discussed/Used Tools: Shells;Pump Nipple shield size:  20 Shell Type: Inverted Breast pump type: Manual;Double-Electric Breast Pump Pump Review: Milk Storage   Consult Status Consult Status: Follow-up(LC offered to request and LC O/P appt and mom requested to be able to call if still using a NS) Follow-up type: Out-patient    Jerlyn Ly Kelsay Haggard 01/15/2019, 10:33 AM

## 2019-01-20 ENCOUNTER — Ambulatory Visit (INDEPENDENT_AMBULATORY_CARE_PROVIDER_SITE_OTHER): Payer: Medicaid Other | Admitting: *Deleted

## 2019-01-20 ENCOUNTER — Encounter: Payer: Self-pay | Admitting: *Deleted

## 2019-01-20 ENCOUNTER — Other Ambulatory Visit: Payer: Self-pay

## 2019-01-20 VITALS — BP 136/87 | HR 74 | Temp 98.5°F | Ht 62.0 in | Wt 169.2 lb

## 2019-01-20 DIAGNOSIS — Z9889 Other specified postprocedural states: Secondary | ICD-10-CM

## 2019-01-20 DIAGNOSIS — R1030 Lower abdominal pain, unspecified: Secondary | ICD-10-CM | POA: Diagnosis not present

## 2019-01-20 DIAGNOSIS — Z4889 Encounter for other specified surgical aftercare: Secondary | ICD-10-CM

## 2019-01-20 LAB — POCT URINALYSIS DIP (DEVICE)
Bilirubin Urine: NEGATIVE
Glucose, UA: NEGATIVE mg/dL
Ketones, ur: NEGATIVE mg/dL
Nitrite: NEGATIVE
Protein, ur: NEGATIVE mg/dL
Specific Gravity, Urine: 1.01 (ref 1.005–1.030)
Urobilinogen, UA: 0.2 mg/dL (ref 0.0–1.0)
pH: 6 (ref 5.0–8.0)

## 2019-01-20 NOTE — Progress Notes (Addendum)
Pt presents for wound check of C/S incision. She delivered on 9/7. Incision was assessed and found to be well healed with no areas of bleeding, redness or swelling. Steri strips are still in place. Pt advised for proper hygiene of the area and she may gently remove steri strips after a shower. She reports having some mild abdominal pain and expressed concern for possible UTI.  UA performed and was not clearly indicative of UTI - specimen sent for culture. Pt will be notified of results via [place]. Post partum appointment is scheduled for 02/18/19. Pt voiced understanding of all information and instructions given.   Chart reviewed for nurse visit. Agree with plan of care.   [place]ia [place], NP 01/20/2019 4:55 PM

## 2019-01-22 LAB — URINE CULTURE

## 2019-02-18 ENCOUNTER — Ambulatory Visit: Payer: Medicaid Other | Admitting: Medical

## 2019-02-25 ENCOUNTER — Ambulatory Visit (INDEPENDENT_AMBULATORY_CARE_PROVIDER_SITE_OTHER): Payer: Medicaid Other | Admitting: Medical

## 2019-02-25 ENCOUNTER — Encounter: Payer: Self-pay | Admitting: Medical

## 2019-02-25 ENCOUNTER — Other Ambulatory Visit: Payer: Self-pay

## 2019-02-25 DIAGNOSIS — Z1389 Encounter for screening for other disorder: Secondary | ICD-10-CM | POA: Diagnosis not present

## 2019-02-25 DIAGNOSIS — Z8632 Personal history of gestational diabetes: Secondary | ICD-10-CM | POA: Insufficient documentation

## 2019-02-25 DIAGNOSIS — Z98891 History of uterine scar from previous surgery: Secondary | ICD-10-CM

## 2019-02-25 DIAGNOSIS — Z3009 Encounter for other general counseling and advice on contraception: Secondary | ICD-10-CM

## 2019-02-25 NOTE — Patient Instructions (Signed)
Cesarean Delivery, Care After This sheet gives you information about how to care for yourself after your procedure. Your health care provider may also give you more specific instructions. If you have problems or questions, contact your health care provider. What can I expect after the procedure? After the procedure, it is common to have:  A small amount of blood or clear fluid coming from the incision.  Some redness, swelling, and pain in your incision area.  Some abdominal pain and soreness.  Vaginal bleeding (lochia). Even though you did not have a vaginal delivery, you will still have vaginal bleeding and discharge.  Pelvic cramps.  Fatigue. You may have pain, swelling, and discomfort in the tissue between your vagina and your anus (perineum) if:  Your C-section was unplanned, and you were allowed to labor and push.  An incision was made in the area (episiotomy) or the tissue tore during attempted vaginal delivery. Follow these instructions at home: Incision care   Follow instructions from your health care provider about how to take care of your incision. Make sure you: ? Wash your hands with soap and water before you change your bandage (dressing). If soap and water are not available, use hand sanitizer. ? If you have a dressing, change it or remove it as told by your health care provider. ? Leave stitches (sutures), skin staples, skin glue, or adhesive strips in place. These skin closures may need to stay in place for 2 weeks or longer. If adhesive strip edges start to loosen and curl up, you may trim the loose edges. Do not remove adhesive strips completely unless your health care provider tells you to do that.  Check your incision area every day for signs of infection. Check for: ? More redness, swelling, or pain. ? More fluid or blood. ? Warmth. ? Pus or a bad smell.  Do not take baths, swim, or use a hot tub until your health care provider says it's okay. Ask your health  care provider if you can take showers.  When you cough or sneeze, hug a pillow. This helps with pain and decreases the chance of your incision opening up (dehiscing). Do this until your incision heals. Medicines  Take over-the-counter and prescription medicines only as told by your health care provider.  If you were prescribed an antibiotic medicine, take it as told by your health care provider. Do not stop taking the antibiotic even if you start to feel better.  Do not drive or use heavy machinery while taking prescription pain medicine. Lifestyle  Do not drink alcohol. This is especially important if you are breastfeeding or taking pain medicine.  Do not use any products that contain nicotine or tobacco, such as cigarettes, e-cigarettes, and chewing tobacco. If you need help quitting, ask your health care provider. Eating and drinking  Drink at least 8 eight-ounce glasses of water every day unless told not to by your health care provider. If you breastfeed, you may need to drink even more water.  Eat high-fiber foods every day. These foods may help prevent or relieve constipation. High-fiber foods include: ? Whole grain cereals and breads. ? Brown rice. ? Beans. ? Fresh fruits and vegetables. Activity   If possible, have someone help you care for your baby and help with household activities for at least a few days after you leave the hospital.  Return to your normal activities as told by your health care provider. Ask your health care provider what activities are safe for   you.  Rest as much as possible. Try to rest or take a nap while your baby is sleeping.  Do not lift anything that is heavier than 10 lbs (4.5 kg), or the limit that you were told, until your health care provider says that it is safe.  Talk with your health care provider about when you can engage in sexual activity. This may depend on your: ? Risk of infection. ? How fast you heal. ? Comfort and desire to  engage in sexual activity. General instructions  Do not use tampons or douches until your health care provider approves.  Wear loose, comfortable clothing and a supportive and well-fitting bra.  Keep your perineum clean and dry. Wipe from front to back when you use the toilet.  If you pass a blood clot, save it and call your health care provider to discuss. Do not flush blood clots down the toilet before you get instructions from your health care provider.  Keep all follow-up visits for you and your baby as told by your health care provider. This is important. Contact a health care provider if:  You have: ? A fever. ? Bad-smelling vaginal discharge. ? Pus or a bad smell coming from your incision. ? Difficulty or pain when urinating. ? A sudden increase or decrease in the frequency of your bowel movements. ? More redness, swelling, or pain around your incision. ? More fluid or blood coming from your incision. ? A rash. ? Nausea. ? Little or no interest in activities you used to enjoy. ? Questions about caring for yourself or your baby.  Your incision feels warm to the touch.  Your breasts turn red or become painful or hard.  You feel unusually sad or worried.  You vomit.  You pass a blood clot from your vagina.  You urinate more than usual.  You are dizzy or light-headed. Get help right away if:  You have: ? Pain that does not go away or get better with medicine. ? Chest pain. ? Difficulty breathing. ? Blurred vision or spots in your vision. ? Thoughts about hurting yourself or your baby. ? New pain in your abdomen or in one of your legs. ? A severe headache.  You faint.  You bleed from your vagina so much that you fill more than one sanitary pad in one hour. Bleeding should not be heavier than your heaviest period. Summary  After the procedure, it is common to have pain at your incision site, abdominal cramping, and slight bleeding from your vagina.  Check  your incision area every day for signs of infection.  Tell your health care provider about any unusual symptoms.  Keep all follow-up visits for you and your baby as told by your health care provider. This information is not intended to replace advice given to you by your health care provider. Make sure you discuss any questions you have with your health care provider. Document Released: 01/13/2002 Document Revised: 10/30/2017 Document Reviewed: 10/30/2017 Elsevier Patient Education  2020 [employer]. Pap Test Why am I having this test? A Pap test, also called a Pap smear, is a screening test to check for signs of:  Cancer of the vagina, cervix, and uterus. The cervix is the lower part of the uterus that opens into the vagina.  Infection.  Changes that may be a sign that cancer is developing (precancerous changes). Women need this test on a regular basis. In general, you should have a Pap test every 3 years until  you reach menopause or age 3. Women aged 30-60 may choose to have their Pap test done at the same time as an HPV (human papillomavirus) test every 5 years (instead of every 3 years). Your health care provider may recommend having Pap tests more or less often depending on your medical conditions and past Pap test results. What kind of sample is taken?  Your health care provider will collect a sample of cells from the surface of your cervix. This will be done using a small cotton swab, plastic spatula, or brush. This sample is often collected during a pelvic exam, when you are lying on your back on an exam table with feet in footrests (stirrups). In some cases, fluids (secretions) from the cervix or vagina may also be collected. How do I prepare for this test?  Be aware of where you are in your menstrual cycle. If you are menstruating on the day of the test, you may be asked to reschedule.  You may need to reschedule if you have a known vaginal infection on the day of the test.   Follow instructions from your health care provider about: ? Changing or stopping your regular medicines. Some medicines can cause abnormal test results, such as digitalis and tetracycline. ? Avoiding douching or taking a bath the day before or the day of the test. Tell a health care provider about:  Any allergies you have.  All medicines you are taking, including vitamins, herbs, eye drops, creams, and over-the-counter medicines.  Any blood disorders you have.  Any surgeries you have had.  Any medical conditions you have.  Whether you are pregnant or may be pregnant. How are the results reported? Your test results will be reported as either abnormal or normal. A false-positive result can occur. A false positive is incorrect because it means that a condition is present when it is not. A false-negative result can occur. A false negative is incorrect because it means that a condition is not present when it is. What do the results mean? A normal test result means that you do not have signs of cancer of the vagina, cervix, or uterus. An abnormal result may mean that you have:  Cancer. A Pap test by itself is not enough to diagnose cancer. You will have more tests done in this case.  Precancerous changes in your vagina, cervix, or uterus.  Inflammation of the cervix.  An STD (sexually transmitted disease).  A fungal infection.  A parasite infection. Talk with your health care provider about what your results mean. Questions to ask your health care provider Ask your health care provider, or the department that is doing the test:  When will my results be ready?  How will I get my results?  What are my treatment options?  What other tests do I need?  What are my next steps? Summary  In general, women should have a Pap test every 3 years until they reach menopause or age 46.  Your health care provider will collect a sample of cells from the surface of your cervix. This will  be done using a small cotton swab, plastic spatula, or brush.  In some cases, fluids (secretions) from the cervix or vagina may also be collected. This information is not intended to replace advice given to you by your health care provider. Make sure you discuss any questions you have with your health care provider. Document Released: 07/14/2002 Document Revised: 12/31/2016 Document Reviewed: 12/31/2016 Elsevier Patient Education  2020 Elsevier  Inc.  

## 2019-02-25 NOTE — Progress Notes (Signed)
Subjective:     Alexis Walters is a 38 y.o. female who presents for a postpartum visit. She is 7 weeks postpartum following a Repeat Cesarean Section. I have fully reviewed the prenatal and intrapartum course. The delivery was at 71 gestational weeks. Outcome: repeat cesarean section, low transverse incision. Anesthesia: spinal. Postpartum course has been unremarkable. Baby's course has been unremarkable. Baby is feeding by breast. Bleeding staining only. Bowel function is normal. Bladder function is normal. Patient is not sexually active. Contraception method is tubal ligation. Postpartum depression screening: Negative.  The following portions of the patient's history were reviewed and updated as appropriate: allergies, current medications, past family history, past medical history, past social history, past surgical history and problem list.  Review of Systems Pertinent items are noted in HPI.   Objective:    BP 129/77   Pulse 78   Wt 163 lb 6.4 oz (74.1 kg)   LMP 04/14/2018 (Exact Date)   BMI 29.89 kg/m   General:  alert and cooperative   Breasts:  not performed  Lungs: clear to auscultation bilaterally  Heart:  regular rate and rhythm, S1, S2 normal, no murmur, click, rub or gallop  Abdomen: soft, non-tender; bowel sounds normal; no masses,  no organomegaly   Vulva:  not evaluated  Vagina: not evaluated  Cervix:  not performed  Corpus: not examined  Adnexa:  not evaluated  Rectal Exam: Not performed.        Assessment:     Normal postpartum exam. Pap smear not done at today's visit.  Last pap was normal in 2018.   Plan:    1. Contraception: tubal ligation 2. Patient will make appt with lactation  3. Follow up in: 1 week for PP 2 hour GTT and in 1 year for annual exam or sooner as needed.    Luvenia Redden, PA-C 02/25/19 3:20 PM

## 2019-02-26 ENCOUNTER — Other Ambulatory Visit: Payer: Self-pay | Admitting: Lactation Services

## 2019-02-26 DIAGNOSIS — Z8632 Personal history of gestational diabetes: Secondary | ICD-10-CM

## 2019-02-27 ENCOUNTER — Other Ambulatory Visit: Payer: Medicaid Other

## 2019-02-27 ENCOUNTER — Other Ambulatory Visit: Payer: Self-pay

## 2019-02-27 DIAGNOSIS — Z8632 Personal history of gestational diabetes: Secondary | ICD-10-CM

## 2019-02-28 LAB — GLUCOSE TOLERANCE, 2 HOURS
Glucose, 2 hour: 86 mg/dL (ref 65–139)
Glucose, GTT - Fasting: 86 mg/dL (ref 65–99)

## 2019-09-02 ENCOUNTER — Other Ambulatory Visit (HOSPITAL_COMMUNITY)
Admission: RE | Admit: 2019-09-02 | Discharge: 2019-09-02 | Disposition: A | Discharge status: Home / Self Care | Payer: Medicaid Other | Source: Ambulatory Visit | Attending: Family Medicine | Admitting: Family Medicine

## 2019-09-02 ENCOUNTER — Other Ambulatory Visit: Payer: Self-pay

## 2019-09-02 ENCOUNTER — Ambulatory Visit (INDEPENDENT_AMBULATORY_CARE_PROVIDER_SITE_OTHER): Payer: Medicaid Other | Admitting: *Deleted

## 2019-09-02 VITALS — BP 124/86 | HR 93 | Ht 62.0 in | Wt 151.7 lb

## 2019-09-02 DIAGNOSIS — Z9189 Other specified personal risk factors, not elsewhere classified: Secondary | ICD-10-CM | POA: Diagnosis present

## 2019-09-02 NOTE — Progress Notes (Signed)
Pt requests testing for STI due to recent unprotected sex with her boyfriend of 4 years who she recently learned has had other partners. She denies vaginal discharge or irritation. Self swab obtained. Pt advised that she will be notified via MyChart of results and treatment if indicated. She voiced understanding.

## 2019-09-03 LAB — CERVICOVAGINAL ANCILLARY ONLY
Bacterial Vaginitis (gardnerella): NEGATIVE
Candida Glabrata: NEGATIVE
Candida Vaginitis: NEGATIVE
Chlamydia: NEGATIVE
Comment: NEGATIVE
Comment: NEGATIVE
Comment: NEGATIVE
Comment: NEGATIVE
Comment: NEGATIVE
Comment: NORMAL
Neisseria Gonorrhea: NEGATIVE
Trichomonas: NEGATIVE

## 2019-09-10 NOTE — Progress Notes (Signed)
Patient seen and assessed by nursing staff during this encounter. I have reviewed the chart and agree with the documentation and plan. I have also made any necessary editorial changes.    Marcille Buffy DNP, CNM  09/10/19  12:44 PM

## 2020-02-17 ENCOUNTER — Telehealth (INDEPENDENT_AMBULATORY_CARE_PROVIDER_SITE_OTHER): Payer: Medicaid Other | Admitting: Medical

## 2020-02-17 DIAGNOSIS — K5900 Constipation, unspecified: Secondary | ICD-10-CM

## 2020-02-17 NOTE — Telephone Encounter (Signed)
Patient called to say since giving birth, she has had trouble going to the bathroom. She can not have a BM, and wants to know what she should do. She does not have a PCP, and has no insurance.

## 2020-02-18 NOTE — Telephone Encounter (Signed)
Returned patients call in regards to having difficulty having bowel movements.   Patient reports she is having difficulty having bowel movements since having her c-section. Patient reports she has a bowel movement once a day or every other day. She is having difficulty passing the stool as it is firm in the beginning and the soft towards the end. She denies hemmorrhoids.   She reports she is drinking plenty of water and is eating fruits and vegetables and eating raisin bran. She is taking iron.   Reviewed continuing eating fiber daily as needed and increase fluids since she is still Breast feeding her baby also.   Reviewed trying Stool Softener (she has at home) twice a day in addition to fiber with gummies, Fibercon, Metamucil, etc daily. Reviewed once stooling easier to decrease once a day and then try to stop stool softener after a few weeks with keeping the extra water and fiber.   Patient voiced understanding.   Patient does not have insurance and does not plan to apply for Medicaid at this time. Reviewed [place] and gave her the phone number to call to ask about self pay patients for cost. Give information for patient to establish care with PCP.

## 2020-09-14 ENCOUNTER — Other Ambulatory Visit: Payer: Self-pay

## 2020-09-14 DIAGNOSIS — N649 Disorder of breast, unspecified: Secondary | ICD-10-CM

## 2020-10-04 ENCOUNTER — Ambulatory Visit: Payer: Self-pay

## 2020-10-04 ENCOUNTER — Other Ambulatory Visit: Payer: Self-pay

## 2020-10-04 ENCOUNTER — Ambulatory Visit: Payer: Medicaid Other

## 2020-10-04 ENCOUNTER — Ambulatory Visit: Payer: Self-pay | Admitting: *Deleted

## 2020-10-04 VITALS — BP 110/70 | Wt 154.6 lb

## 2020-10-04 DIAGNOSIS — R2231 Localized swelling, mass and lump, right upper limb: Secondary | ICD-10-CM

## 2020-10-04 DIAGNOSIS — Z01419 Encounter for gynecological examination (general) (routine) without abnormal findings: Secondary | ICD-10-CM

## 2020-10-04 NOTE — Patient Instructions (Signed)
Explained breast self awareness with Alexis Walters. Patient did not need a Pap smear today due to last Pap smear was 09/25/2016 per patient. Let her know [facility] will cover Pap smears every 3 years unless has a history of abnormal Pap smears. Referred patient to the [facility] for a diagnostic mammogram. Appointment scheduled Tuesday, Oct 04, 2020 at 1240. Patient aware of appointment and will be there. Let patient know will follow up with her within the next week with results of her Pap smear and wet prep by phone. Alexis Walters verbalized understanding.  Alexis Walters, Alexis Chaco, RN 11:48 AM

## 2020-10-04 NOTE — Progress Notes (Signed)
[name] is a 40 y.o. G2P1011 female who presents to [facility] clinic today with complaint of brownish area right breast and right lower breast pain x 2 years. Patient states the pain comes and goes. Patient rated the pain at a 2-3 out of 10.    Pap Smear: Pap smear completed today. Last Pap smear was 09/25/2016 at Dr. [facility] office in [place], [place] clinic and was normal per patient. Per patient has no history of an abnormal Pap smear. Last Pap smear result is not available in Epic.   Physical exam: Breasts Breasts symmetrical. No skin abnormalities left breast. Observed a darkened area right breast between 6 o'clock and 8 o'clock next to areola that measures 5 cm x 4 cm. No nipple retraction bilateral breasts. No nipple discharge bilateral breasts. No lymphadenopathy. No lumps palpated left breast. Palpated a lump versus swollen lymph node right axilla at 11 o'clock 17 cm from the nipple. No complaints of pain or tenderness on exam.     Pelvic/Bimanual Ext Genitalia No lesions, no swelling and no discharge observed on external genitalia.        Vagina Vagina pink and normal texture. No lesions and white frothy discharge observed in vagina. Wet prep completed.         Cervix Cervix is present. Cervix pink and of normal texture. Cervix friable. No discharge observed.    Uterus Uterus is present and palpable. Uterus in normal position and normal size.        Adnexae Bilateral ovaries present and palpable. No tenderness on palpation.         Rectovaginal No rectal exam completed today since patient had no rectal complaints. No skin abnormalities observed on exam.     Smoking History: Patient has never smoked.   Patient Navigation: Patient education provided. Access to services provided for patient through [facility] program.    Breast and Cervical Cancer Risk Assessment: Patient does not have family history of breast cancer, known genetic mutations, or radiation treatment to  the chest before age 76. Patient does not have history of cervical dysplasia, immunocompromised, or DES exposure in-utero.  Risk Assessment    Risk Scores      10/04/2020   Last edited by: Demetrius Revel, LPN   5-year risk: 0.5 %   Lifetime risk: 9.7 %         A: [facility] exam with pap smear Complaint of brownish area and pain right breast.   P: Referred patient to the [facility] for a diagnostic mammogram. Appointment scheduled Tuesday, Oct 04, 2020 at 1240.  Loletta Parish, RN 10/04/2020 11:47 AM

## 2020-10-05 ENCOUNTER — Telehealth: Payer: Self-pay

## 2020-10-05 LAB — CERVICOVAGINAL ANCILLARY ONLY
Bacterial Vaginitis (gardnerella): NEGATIVE
Candida Glabrata: NEGATIVE
Candida Vaginitis: NEGATIVE
Comment: NEGATIVE
Comment: NEGATIVE
Comment: NEGATIVE
Comment: NEGATIVE
Trichomonas: NEGATIVE

## 2020-10-05 LAB — CYTOLOGY - PAP
Comment: NEGATIVE
Diagnosis: NEGATIVE
High risk HPV: NEGATIVE

## 2020-10-05 NOTE — Telephone Encounter (Addendum)
Patient returned call, was informed negative Pap/HPV, Wet prep results, next pap due in 5 years. Patient verbalized understanding. Patient stated her mammogram (diagnostic) was rescheduled for 10/18/2020, she went to wrong location, and upon arrival at the [facility], was told too late to get mammogram.  Attempted to contact patient regarding pap test results. Left message on voicemail requesting a return call.

## 2020-10-18 ENCOUNTER — Ambulatory Visit
Admission: RE | Admit: 2020-10-18 | Discharge: 2020-10-18 | Disposition: A | Discharge status: Home / Self Care | Payer: Self-pay | Source: Ambulatory Visit | Attending: Obstetrics and Gynecology | Admitting: Obstetrics and Gynecology

## 2020-10-18 ENCOUNTER — Ambulatory Visit
Admission: RE | Admit: 2020-10-18 | Discharge: 2020-10-18 | Disposition: A | Discharge status: Home / Self Care | Payer: No Typology Code available for payment source | Source: Ambulatory Visit | Attending: Obstetrics and Gynecology | Admitting: Obstetrics and Gynecology

## 2020-10-18 ENCOUNTER — Other Ambulatory Visit: Payer: Self-pay

## 2020-10-18 DIAGNOSIS — N649 Disorder of breast, unspecified: Secondary | ICD-10-CM

## 2022-08-30 IMAGING — MG DIGITAL DIAGNOSTIC BILAT W/ TOMO W/ CAD
6 of 12 series · 6 of 36 positions shown · non-contrast
Comparison: None.

CLINICAL DATA: Patient has bilateral skin lesions on her breasts.
She recalls lesions have been present for at least 2 years. She
reports no injury, known trauma, surgery, or infection of either
breast. She does report having significant enlargement of both
breasts with for pregnancy and had numerous stretch marks. Patient
has keloids at scars related to previous nipple piercings and
C-section scar.

EXAM:
DIGITAL DIAGNOSTIC BILATERAL MAMMOGRAM WITH TOMOSYNTHESIS AND CAD;
ULTRASOUND RIGHT BREAST LIMITED
TECHNIQUE: Bilateral digital diagnostic mammography and breast tomosynthesis
was performed. The images were evaluated with computer-aided
detection.; Targeted ultrasound examination of the right breast was
performed

[L TAN synth-2D]
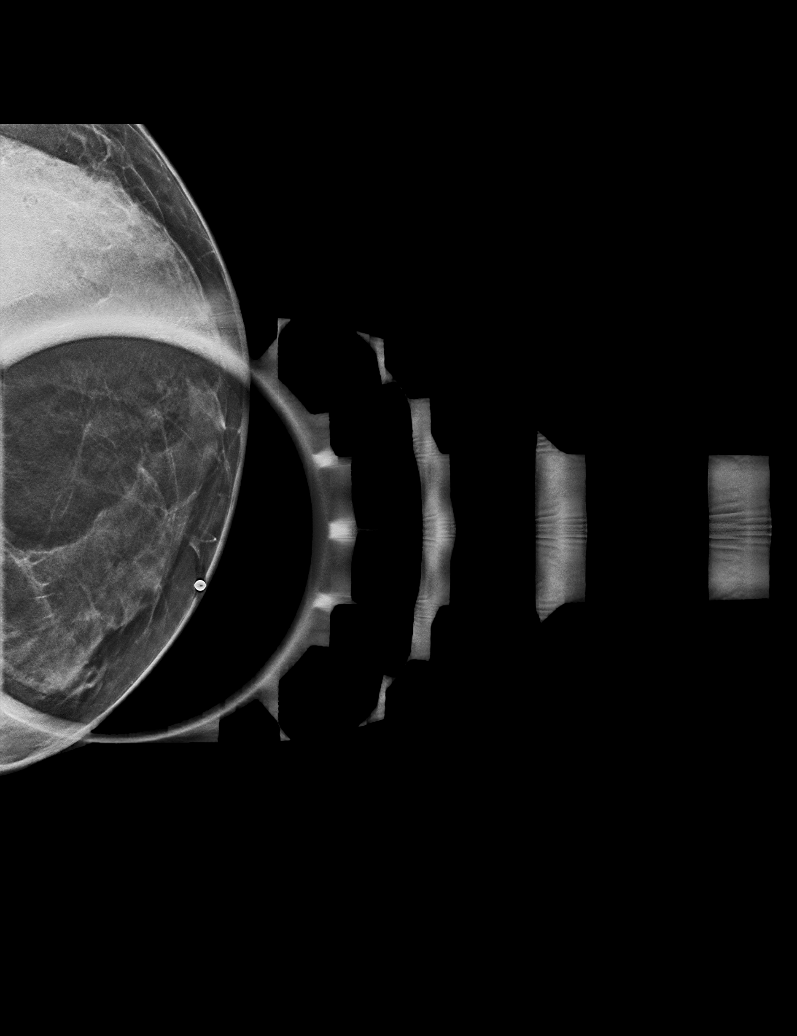

[L MLO synth-2D]
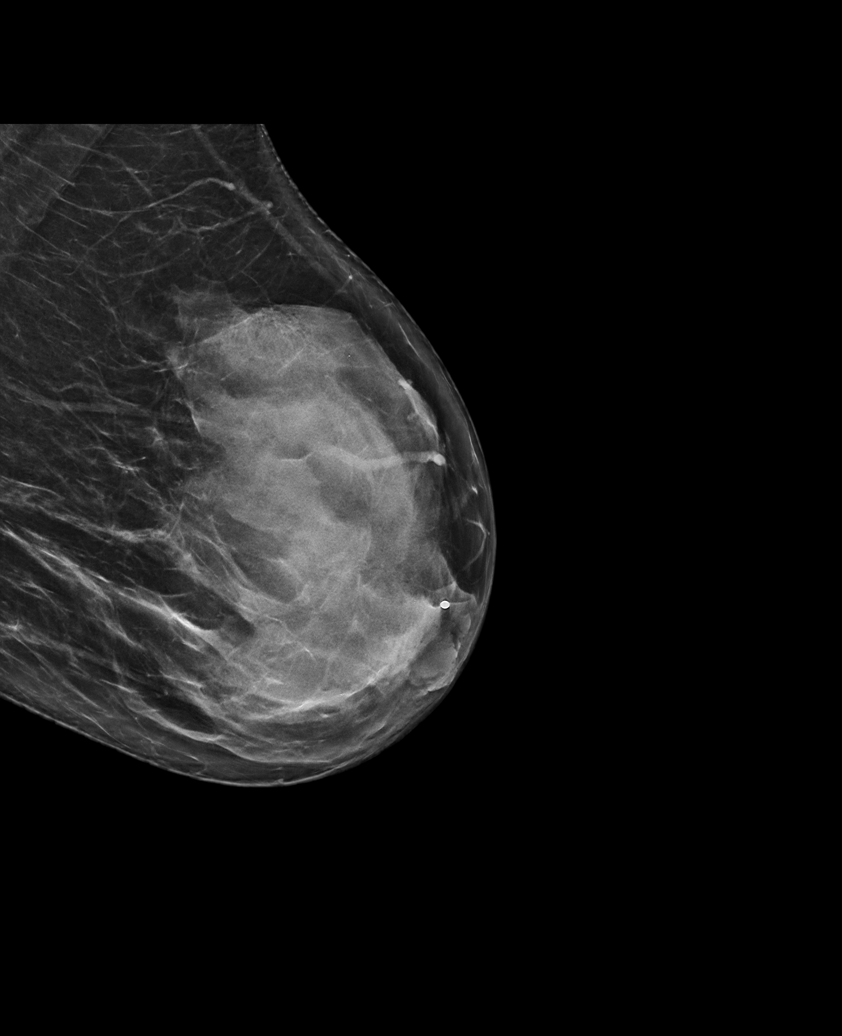

[R CC synth-2D]
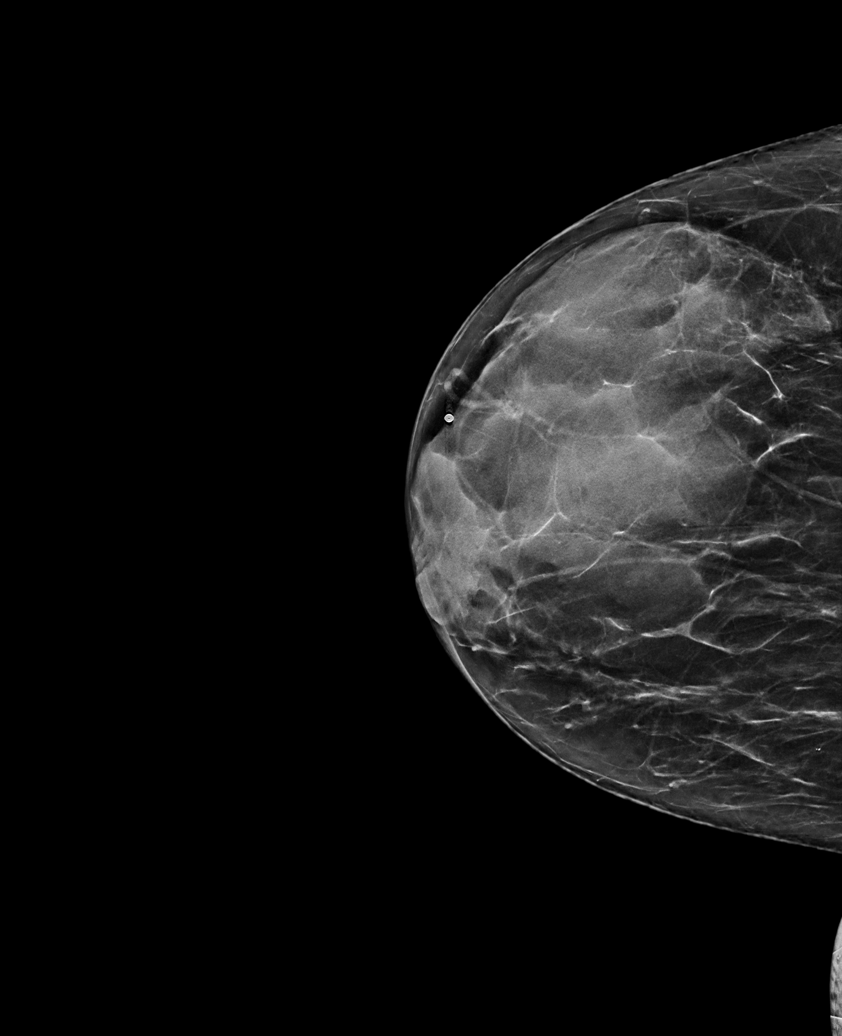

[R TAN synth-2D]
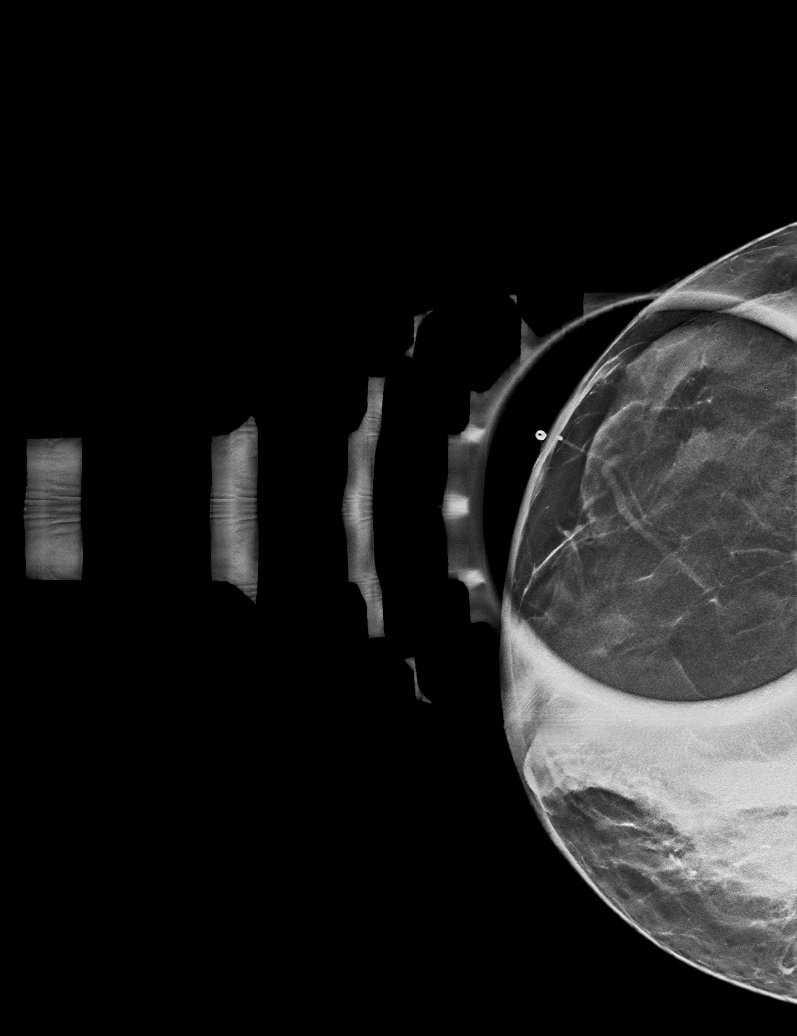

[R MLO synth-2D]
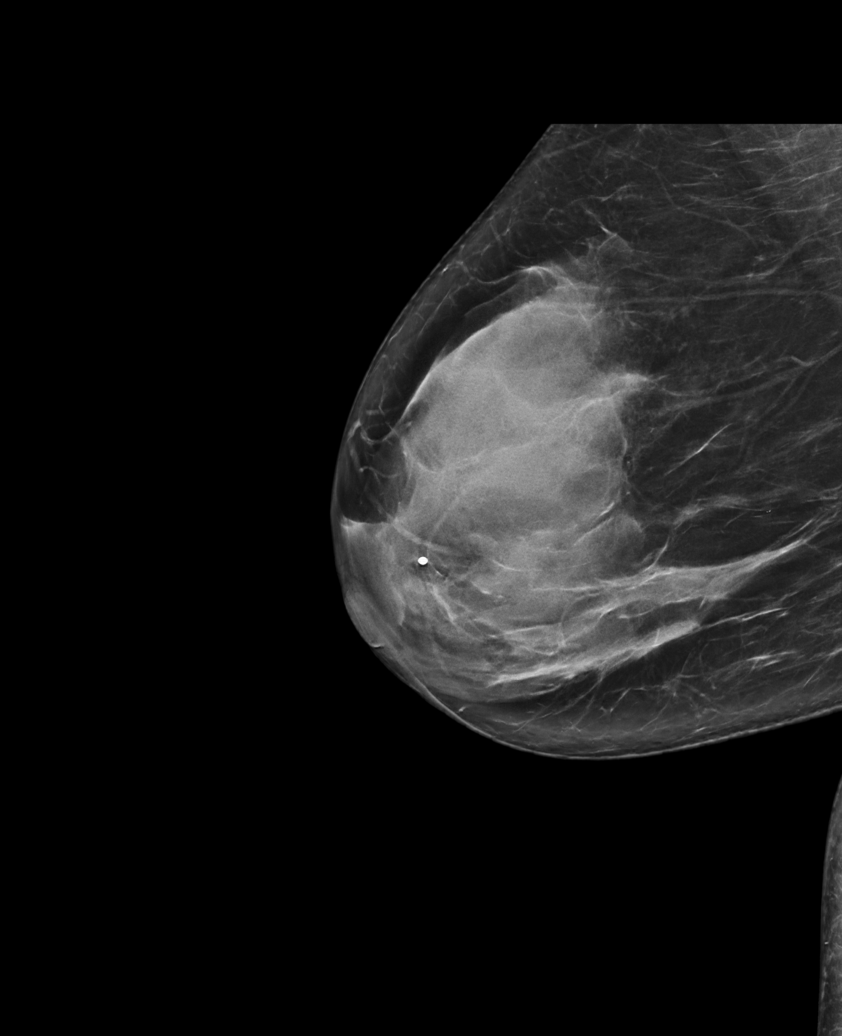

[L CC synth-2D]
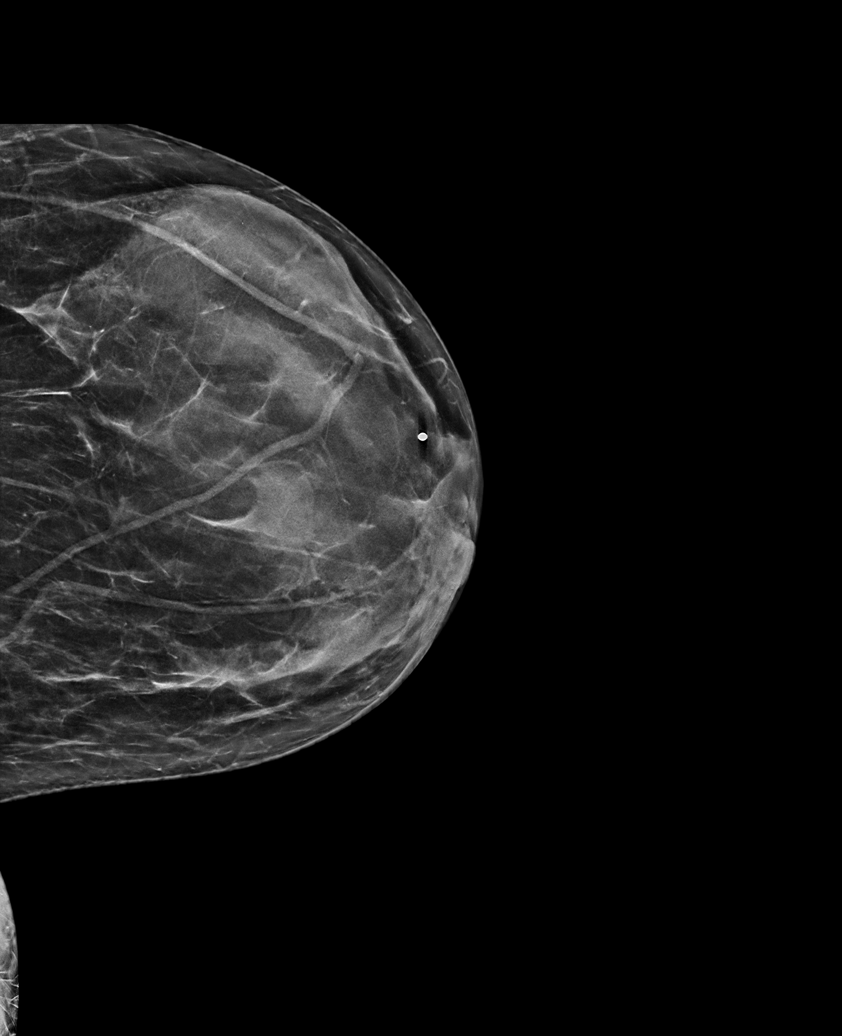

[6 of 36 positions shown; findings below may reference images not displayed]

ACR Breast Density Category d: The breast tissue is extremely dense,
which lowers the sensitivity of mammography.
FINDINGS: No suspicious mass, distortion, or microcalcifications are
identified to suggest presence of malignancy. Spot tangential view
of the RIGHT breast shows focal skin thickening but no underlying
mass. Spot tangential view of the LEFT breast shows no skin
thickening.

On physical exam, of the RIGHT breast, in the 7 o'clock location,
there is an X shaped hyper pigmented hypertrophied scar, measuring
at least 6-7 centimeters. There is no associated inflammation,
induration, or underlying mass.

On the LEFT breast, in the five o'clock location, there is a 1
centimeter hyper pigmented macule. I palpate no discrete skin
thickening or underlying mass in this region.

Targeted ultrasound is performed of the lesion in the RIGHT breast,
showing focal skin thickening without underlying mass. The patient
also reports her practitioner commenting on fullness in the RIGHT
axilla on recent physical exam. Ultrasound of the RIGHT axilla shows
small fat pad lymph nodes with normal morphology.
IMPRESSION: 1.  No mammographic or ultrasound evidence for malignancy.
2. Bilateral skin lesions are favored to represent keloid scars,
RIGHT greater than LEFT.
3. No imaging abnormality identified in the RIGHT axilla.

RECOMMENDATION:
Screening mammogram in one year.(Code:45-P-U7F)

I have discussed the findings and recommendations with the patient.
If applicable, a reminder letter will be sent to the patient
regarding the next appointment.

BI-RADS CATEGORY  1: Negative.

## 2022-08-30 IMAGING — US US BREAST*R* LIMITED INC AXILLA
1 series · 6 of 6 positions shown · non-contrast
Comparison: None.

CLINICAL DATA: Patient has bilateral skin lesions on her breasts.
She recalls lesions have been present for at least 2 years. She
reports no injury, known trauma, surgery, or infection of either
breast. She does report having significant enlargement of both
breasts with for pregnancy and had numerous stretch marks. Patient
has keloids at scars related to previous nipple piercings and
C-section scar.

EXAM:
DIGITAL DIAGNOSTIC BILATERAL MAMMOGRAM WITH TOMOSYNTHESIS AND CAD;
ULTRASOUND RIGHT BREAST LIMITED
TECHNIQUE: Bilateral digital diagnostic mammography and breast tomosynthesis
was performed. The images were evaluated with computer-aided
detection.; Targeted ultrasound examination of the right breast was
performed

[Series 1: us breast*right* limited inc axilla · 0.02mm/px · 6 of 6 slices shown]
[im 1/6]
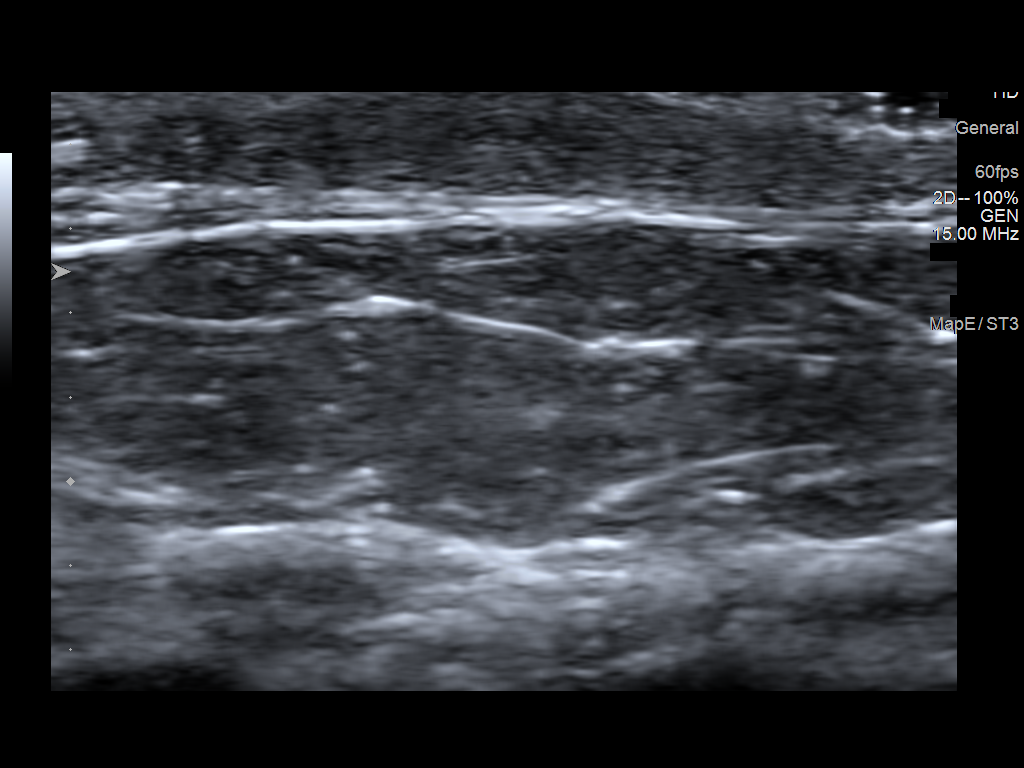
[im 2/6]
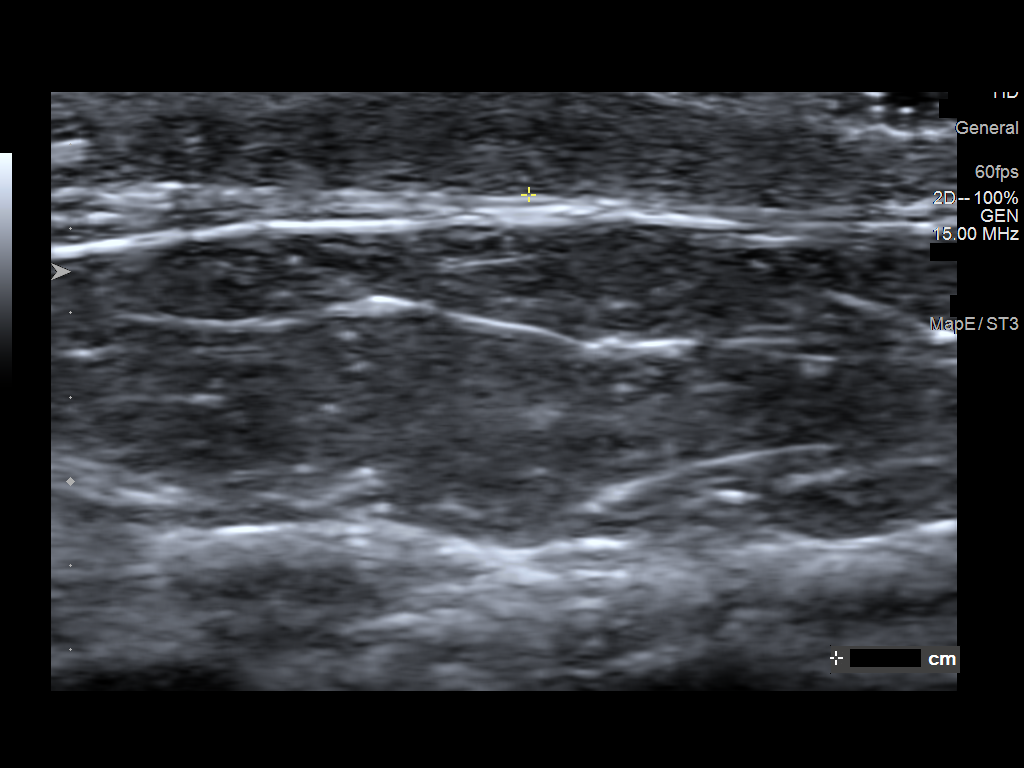
[im 3/6]
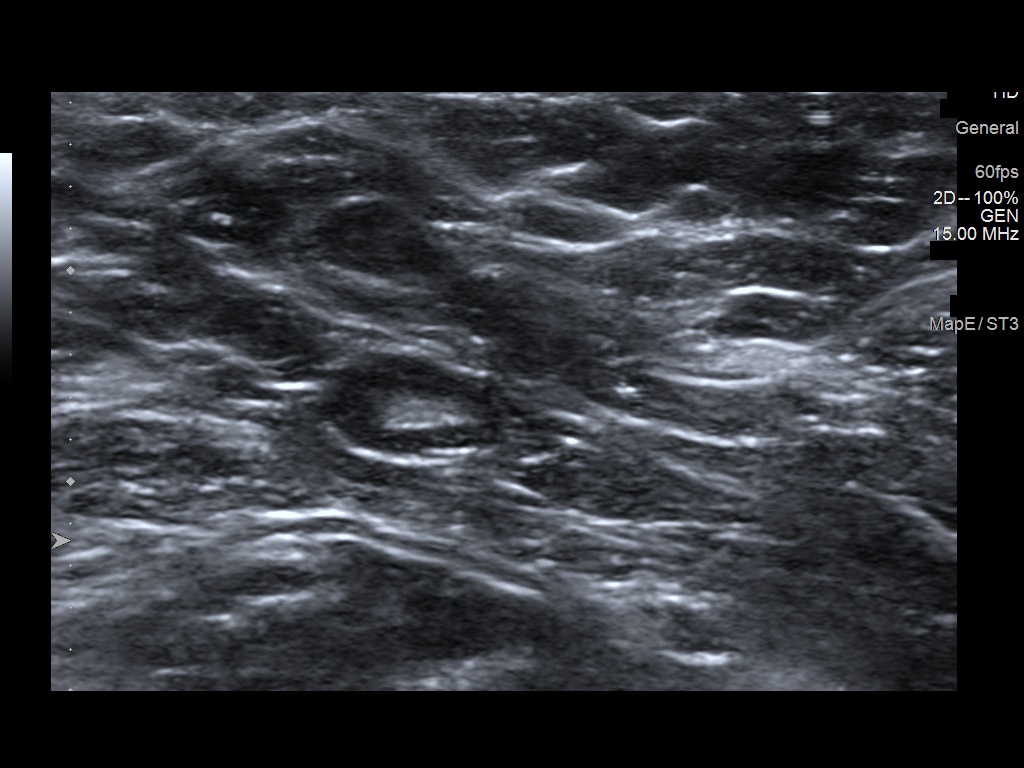
[im 4/6]
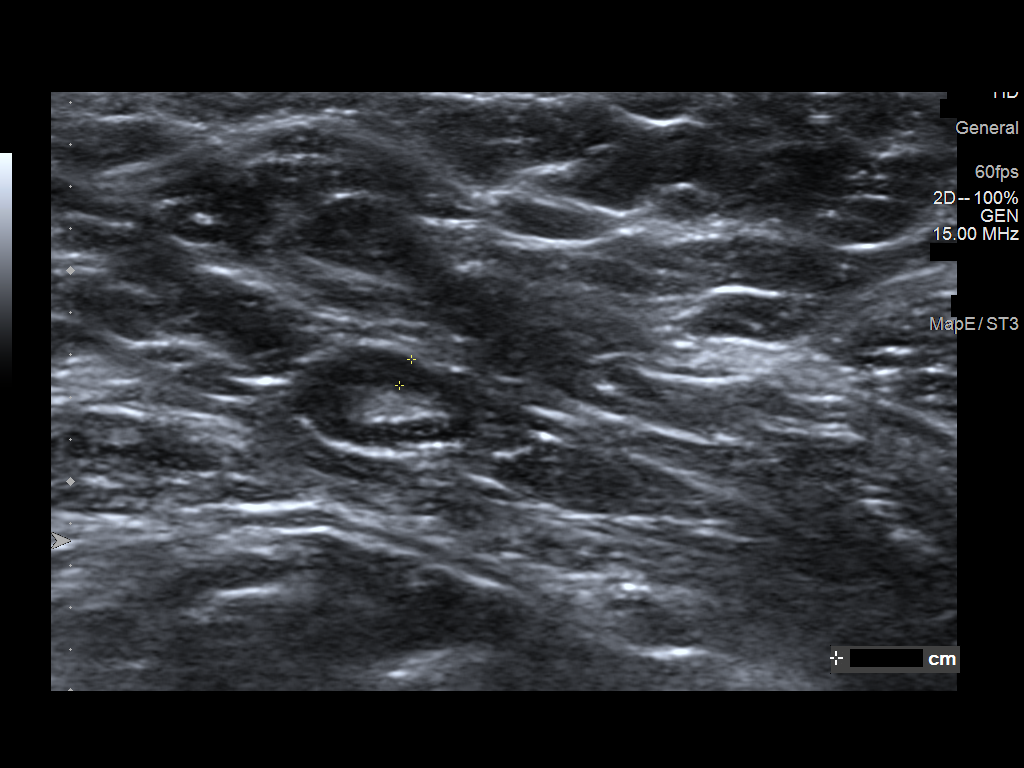
[im 5/6]
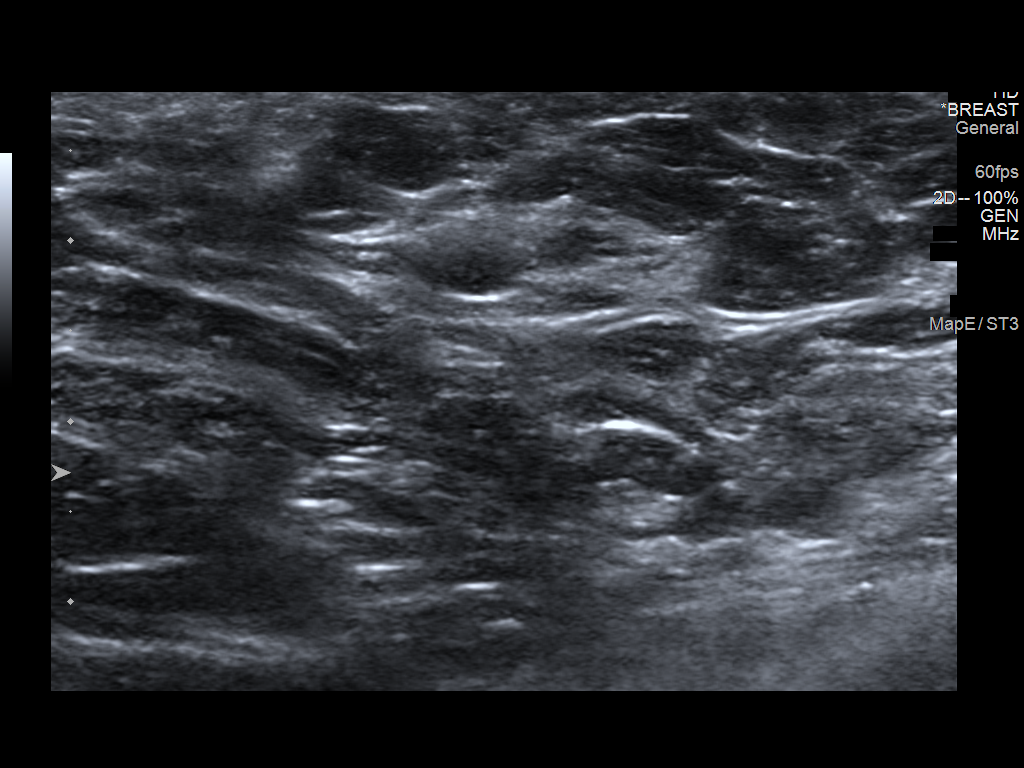
[im 6/6]
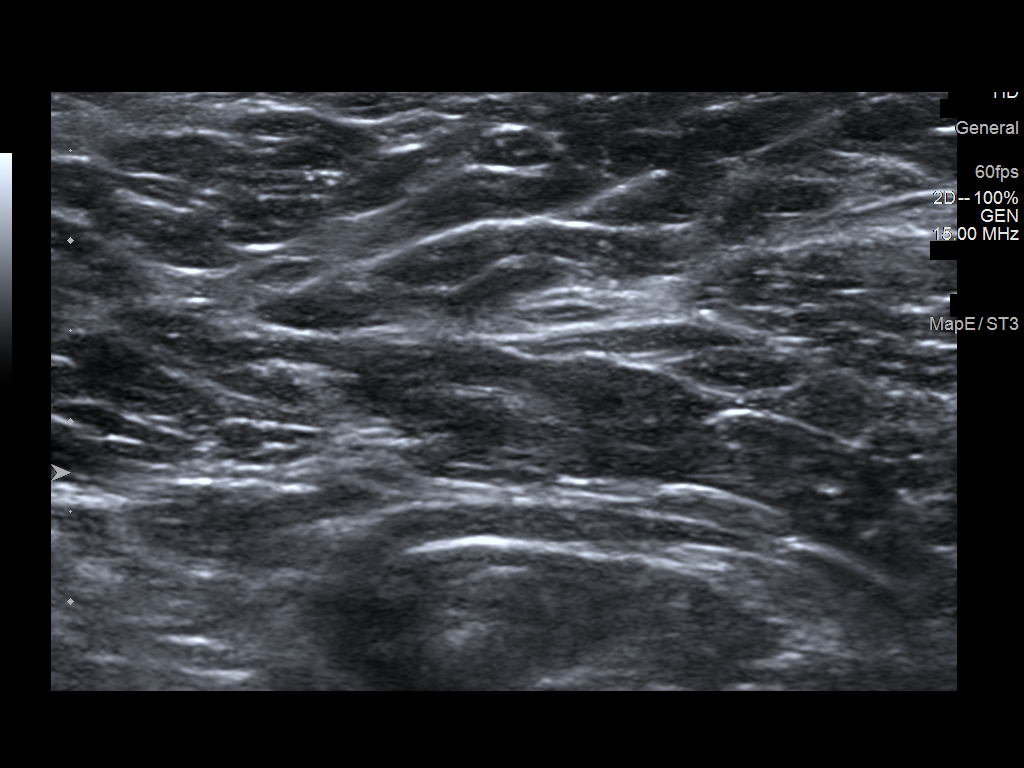

[6 of 6 positions shown; findings below may reference images not displayed]

ACR Breast Density Category d: The breast tissue is extremely dense,
which lowers the sensitivity of mammography.
FINDINGS: No suspicious mass, distortion, or microcalcifications are
identified to suggest presence of malignancy. Spot tangential view
of the RIGHT breast shows focal skin thickening but no underlying
mass. Spot tangential view of the LEFT breast shows no skin
thickening.

On physical exam, of the RIGHT breast, in the 7 o'clock location,
there is an X shaped hyper pigmented hypertrophied scar, measuring
at least 6-7 centimeters. There is no associated inflammation,
induration, or underlying mass.

On the LEFT breast, in the five o'clock location, there is a 1
centimeter hyper pigmented macule. I palpate no discrete skin
thickening or underlying mass in this region.

Targeted ultrasound is performed of the lesion in the RIGHT breast,
showing focal skin thickening without underlying mass. The patient
also reports her practitioner commenting on fullness in the RIGHT
axilla on recent physical exam. Ultrasound of the RIGHT axilla shows
small fat pad lymph nodes with normal morphology.
IMPRESSION: 1.  No mammographic or ultrasound evidence for malignancy.
2. Bilateral skin lesions are favored to represent keloid scars,
RIGHT greater than LEFT.
3. No imaging abnormality identified in the RIGHT axilla.

RECOMMENDATION:
Screening mammogram in one year.(Code:45-[id number])

I have discussed the findings and recommendations with the patient.
If applicable, a reminder letter will be sent to the patient
regarding the next appointment.

BI-RADS CATEGORY  1: Negative.

## 2023-11-22 ENCOUNTER — Encounter: Payer: Self-pay | Admitting: Advanced Practice Midwife
